# Patient Record
Sex: Female | Born: 1975 | Race: White | Hispanic: No | Marital: Married | Smoking: Never smoker
Health system: Southern US, Community
[De-identification: ages and names within clinical notes are randomized; demographics above are authoritative.]

## PROBLEM LIST (undated history)

## (undated) DIAGNOSIS — F419 Anxiety disorder, unspecified: Secondary | ICD-10-CM

## (undated) DIAGNOSIS — T8859XA Other complications of anesthesia, initial encounter: Secondary | ICD-10-CM

## (undated) DIAGNOSIS — K219 Gastro-esophageal reflux disease without esophagitis: Secondary | ICD-10-CM

## (undated) DIAGNOSIS — F32A Depression, unspecified: Secondary | ICD-10-CM

## (undated) DIAGNOSIS — R51 Headache: Secondary | ICD-10-CM

## (undated) DIAGNOSIS — Z9889 Other specified postprocedural states: Secondary | ICD-10-CM

## (undated) DIAGNOSIS — R112 Nausea with vomiting, unspecified: Secondary | ICD-10-CM

## (undated) DIAGNOSIS — F329 Major depressive disorder, single episode, unspecified: Secondary | ICD-10-CM

## (undated) DIAGNOSIS — M7989 Other specified soft tissue disorders: Secondary | ICD-10-CM

## (undated) DIAGNOSIS — R519 Headache, unspecified: Secondary | ICD-10-CM

## (undated) DIAGNOSIS — T4145XA Adverse effect of unspecified anesthetic, initial encounter: Secondary | ICD-10-CM

## (undated) HISTORY — PX: COLONOSCOPY: SHX174

## (undated) HISTORY — PX: WISDOM TOOTH EXTRACTION: SHX21

## (undated) HISTORY — PX: CHOLECYSTECTOMY: SHX55

## (undated) HISTORY — PX: ECTOPIC PREGNANCY SURGERY: SHX613

---

## 1998-04-11 ENCOUNTER — Other Ambulatory Visit: Admission: RE | Admit: 1998-04-11 | Discharge: 1998-04-11 | Payer: Self-pay | Admitting: Family Medicine

## 2001-06-28 ENCOUNTER — Other Ambulatory Visit: Admission: RE | Admit: 2001-06-28 | Discharge: 2001-06-28 | Payer: Self-pay | Admitting: Family Medicine

## 2002-07-04 ENCOUNTER — Encounter: Payer: Self-pay | Admitting: Family Medicine

## 2002-07-04 ENCOUNTER — Encounter: Admission: RE | Admit: 2002-07-04 | Discharge: 2002-07-04 | Payer: Self-pay | Admitting: Family Medicine

## 2002-07-04 ENCOUNTER — Other Ambulatory Visit: Admission: RE | Admit: 2002-07-04 | Discharge: 2002-07-04 | Payer: Self-pay | Admitting: Family Medicine

## 2002-10-06 ENCOUNTER — Ambulatory Visit (HOSPITAL_COMMUNITY): Admission: RE | Admit: 2002-10-06 | Discharge: 2002-10-06 | Payer: Self-pay | Admitting: Gastroenterology

## 2002-11-16 ENCOUNTER — Encounter: Admission: RE | Admit: 2002-11-16 | Discharge: 2002-11-16 | Payer: Self-pay | Admitting: Family Medicine

## 2002-11-16 ENCOUNTER — Encounter: Payer: Self-pay | Admitting: Family Medicine

## 2003-07-26 ENCOUNTER — Other Ambulatory Visit: Admission: RE | Admit: 2003-07-26 | Discharge: 2003-07-26 | Payer: Self-pay | Admitting: Family Medicine

## 2003-10-02 ENCOUNTER — Encounter: Payer: Self-pay | Admitting: Family Medicine

## 2003-10-02 ENCOUNTER — Encounter: Admission: RE | Admit: 2003-10-02 | Discharge: 2003-10-02 | Payer: Self-pay | Admitting: Family Medicine

## 2004-02-19 ENCOUNTER — Other Ambulatory Visit: Admission: RE | Admit: 2004-02-19 | Discharge: 2004-02-19 | Payer: Self-pay | Admitting: Obstetrics and Gynecology

## 2004-09-02 ENCOUNTER — Other Ambulatory Visit: Admission: RE | Admit: 2004-09-02 | Discharge: 2004-09-02 | Payer: Self-pay | Admitting: Obstetrics and Gynecology

## 2005-10-14 ENCOUNTER — Other Ambulatory Visit: Admission: RE | Admit: 2005-10-14 | Discharge: 2005-10-14 | Payer: Self-pay | Admitting: Family Medicine

## 2005-10-17 ENCOUNTER — Encounter: Admission: RE | Admit: 2005-10-17 | Discharge: 2005-10-17 | Payer: Self-pay | Admitting: Family Medicine

## 2017-07-09 ENCOUNTER — Encounter (HOSPITAL_COMMUNITY): Payer: Self-pay | Admitting: Emergency Medicine

## 2017-07-09 ENCOUNTER — Inpatient Hospital Stay (HOSPITAL_COMMUNITY)
Admission: EM | Admit: 2017-07-09 | Discharge: 2017-07-12 | DRG: 494 | Disposition: A | Discharge status: Home Health | Payer: BC Managed Care – PPO | Attending: Specialist | Admitting: Specialist

## 2017-07-09 ENCOUNTER — Emergency Department (HOSPITAL_COMMUNITY): Payer: BC Managed Care – PPO

## 2017-07-09 DIAGNOSIS — S82839A Other fracture of upper and lower end of unspecified fibula, initial encounter for closed fracture: Secondary | ICD-10-CM | POA: Diagnosis present

## 2017-07-09 DIAGNOSIS — S82201A Unspecified fracture of shaft of right tibia, initial encounter for closed fracture: Secondary | ICD-10-CM | POA: Diagnosis not present

## 2017-07-09 DIAGNOSIS — S82251A Displaced comminuted fracture of shaft of right tibia, initial encounter for closed fracture: Principal | ICD-10-CM | POA: Diagnosis present

## 2017-07-09 DIAGNOSIS — Y9321 Activity, ice skating: Secondary | ICD-10-CM

## 2017-07-09 DIAGNOSIS — Z79899 Other long term (current) drug therapy: Secondary | ICD-10-CM

## 2017-07-09 DIAGNOSIS — Z7982 Long term (current) use of aspirin: Secondary | ICD-10-CM

## 2017-07-09 DIAGNOSIS — S82251D Displaced comminuted fracture of shaft of right tibia, subsequent encounter for closed fracture with routine healing: Secondary | ICD-10-CM

## 2017-07-09 DIAGNOSIS — S82891A Other fracture of right lower leg, initial encounter for closed fracture: Secondary | ICD-10-CM | POA: Diagnosis present

## 2017-07-09 DIAGNOSIS — E669 Obesity, unspecified: Secondary | ICD-10-CM | POA: Diagnosis present

## 2017-07-09 DIAGNOSIS — S82401A Unspecified fracture of shaft of right fibula, initial encounter for closed fracture: Secondary | ICD-10-CM

## 2017-07-09 DIAGNOSIS — Z6831 Body mass index (BMI) 31.0-31.9, adult: Secondary | ICD-10-CM

## 2017-07-09 DIAGNOSIS — Z419 Encounter for procedure for purposes other than remedying health state, unspecified: Secondary | ICD-10-CM

## 2017-07-09 DIAGNOSIS — S82209A Unspecified fracture of shaft of unspecified tibia, initial encounter for closed fracture: Secondary | ICD-10-CM

## 2017-07-09 HISTORY — DX: Adverse effect of unspecified anesthetic, initial encounter: T41.45XA

## 2017-07-09 HISTORY — DX: Other complications of anesthesia, initial encounter: T88.59XA

## 2017-07-09 HISTORY — DX: Other specified postprocedural states: R11.2

## 2017-07-09 HISTORY — DX: Other specified postprocedural states: Z98.890

## 2017-07-09 LAB — COMPREHENSIVE METABOLIC PANEL
ALBUMIN: 4 g/dL (ref 3.5–5.0)
ALK PHOS: 63 U/L (ref 38–126)
ALT: 15 U/L (ref 14–54)
AST: 27 U/L (ref 15–41)
Anion gap: 10 (ref 5–15)
BUN: 14 mg/dL (ref 6–20)
CALCIUM: 8.6 mg/dL — AB (ref 8.9–10.3)
CHLORIDE: 111 mmol/L (ref 101–111)
CO2: 21 mmol/L — ABNORMAL LOW (ref 22–32)
CREATININE: 0.87 mg/dL (ref 0.44–1.00)
GFR calc Af Amer: >60 mL/min (ref >60)
GFR calc non Af Amer: >60 mL/min (ref >60)
GLUCOSE: 110 mg/dL — AB (ref 65–99)
Potassium: 4.2 mmol/L (ref 3.5–5.1)
SODIUM: 142 mmol/L (ref 135–145)
Total Bilirubin: 0.2 mg/dL — ABNORMAL LOW (ref 0.3–1.2)
Total Protein: 6.8 g/dL (ref 6.5–8.1)

## 2017-07-09 LAB — CBC
HCT: 39.4 % (ref 36.0–46.0)
Hemoglobin: 13.9 g/dL (ref 12.0–15.0)
MCH: 32.4 pg (ref 26.0–34.0)
MCHC: 35.3 g/dL (ref 30.0–36.0)
MCV: 91.8 fL (ref 78.0–100.0)
PLATELETS: 170 10*3/uL (ref 150–400)
RBC: 4.29 MIL/uL (ref 3.87–5.11)
RDW: 12.9 % (ref 11.5–15.5)
WBC: 11.7 10*3/uL — ABNORMAL HIGH (ref 4.0–10.5)

## 2017-07-09 LAB — I-STAT BETA HCG BLOOD, ED (MC, WL, AP ONLY)

## 2017-07-09 MED ORDER — SODIUM CHLORIDE 0.9 % IV BOLUS (SEPSIS)
1000.0000 mL | Freq: Once | INTRAVENOUS | Status: AC
  Administered 2017-07-09: 1000 mL via INTRAVENOUS

## 2017-07-09 MED ORDER — ONDANSETRON HCL 4 MG/2ML IJ SOLN
4.0000 mg | Freq: Once | INTRAMUSCULAR | Status: AC
  Administered 2017-07-09: 4 mg via INTRAVENOUS
  Filled 2017-07-09: qty 2

## 2017-07-09 MED ORDER — MORPHINE SULFATE (PF) 2 MG/ML IV SOLN
INTRAVENOUS | Status: AC
  Filled 2017-07-09: qty 1

## 2017-07-09 MED ORDER — MORPHINE SULFATE (PF) 4 MG/ML IV SOLN: 4.0000 mg | Freq: Once | INTRAVENOUS | Status: DC

## 2017-07-09 MED ORDER — OXYCODONE-ACETAMINOPHEN 5-325 MG PO TABS
2.0000 | ORAL_TABLET | Freq: Once | ORAL | Status: AC
  Administered 2017-07-09: 2 via ORAL
  Filled 2017-07-09: qty 2

## 2017-07-09 MED ORDER — MORPHINE SULFATE (PF) 4 MG/ML IV SOLN
4.0000 mg | Freq: Once | INTRAVENOUS | Status: AC
  Administered 2017-07-09: 4 mg via INTRAVENOUS
  Filled 2017-07-09: qty 1

## 2017-07-09 MED ORDER — MORPHINE SULFATE (PF) 4 MG/ML IV SOLN
4.0000 mg | INTRAVENOUS | Status: DC | PRN
  Administered 2017-07-09: 4 mg via INTRAVENOUS
  Administered 2017-07-10: 2 mg via INTRAVENOUS
  Administered 2017-07-10 (×2): 4 mg via INTRAVENOUS
  Filled 2017-07-09 (×6): qty 1

## 2017-07-09 MED ORDER — FENTANYL CITRATE (PF) 100 MCG/2ML IJ SOLN
INTRAMUSCULAR | Status: AC
  Administered 2017-07-09: 100 ug
  Filled 2017-07-09: qty 2

## 2017-07-09 NOTE — ED Notes (Signed)
Post Morphine administration, patient began to get pale, diaphoretic, nauseated,and hypotensive. MD notified. NS bolus initiated.

## 2017-07-09 NOTE — Progress Notes (Signed)
Orthopedic Tech Progress Note Patient Details:  Adonis HousekeeperDiana S Coleman- Rossy 05/21/1976 [id number]  Spoke to Dr. Clarene DukeLittle and she ordered a posterior short leg splint with stirrup. Ortho Devices Type of Ortho Device: Post (short) splint, Ace wrap, Stirrup splint Splint Material: Plaster Ortho Device/Splint Location: Well padded plaster posterior and stirrup splint to Rt Leg Ortho Device/Splint Interventions: Application, Adjustment   Clois Dupesvery S Namira Rosekrans 07/09/2017, 10:03 PM

## 2017-07-09 NOTE — ED Notes (Signed)
Bed: [id number] Expected date:  Expected time:  Means of arrival:  Comments: EMS-leg deformity

## 2017-07-09 NOTE — ED Provider Notes (Signed)
[facility]-EMERGENCY DEPT Provider Note   CSN: [id number] Arrival date & time: 07/09/17  1803     History   Chief Complaint Chief Complaint  Patient presents with  . Leg Injury    right    HPI Hailey Carter is a 41 y.o. female.  41 year old healthy female who presents with right leg injury. Just prior to arrival, the patient was ice skating when she fell and somehow twisted her right lower leg during the fall. She did not strike her head or lose consciousness. She had a sudden onset of severe, constant pain in her right lower leg. She reports normal sensation to her foot. No neck, head, back, chest, or abdominal pain. She last ate at 2:30 PM, last drink of water at 3:30 PM. She received 200 g fentanyl in route.   The history is provided by the patient.    History reviewed. No pertinent past medical history.  Patient Active Problem List   Diagnosis Date Noted  . Tibia/fibula fracture, right, closed, initial encounter 07/09/2017    Past Surgical History:  Procedure Laterality Date  . CHOLECYSTECTOMY    . COLONOSCOPY    . ECTOPIC PREGNANCY SURGERY    . WISDOM TOOTH EXTRACTION      OB History    No data available       Home Medications    Prior to Admission medications   Medication Sig Start Date End Date Taking? Authorizing Provider  acetaminophen (TYLENOL) 325 MG tablet Take 650 mg by mouth every 6 (six) hours as needed for moderate pain.   Yes [provider]  aspirin EC 81 MG tablet Take 81 mg by mouth daily.   Yes [provider]  folic acid (FOLVITE) 1 MG tablet Take 4 mg by mouth daily.   Yes [provider]  montelukast (SINGULAIR) 10 MG tablet Take 10 mg by mouth at bedtime.   Yes [provider]  Multiple Vitamin (MULTIVITAMIN WITH MINERALS) TABS tablet Take 1 tablet by mouth daily.   Yes [provider]  sertraline (ZOLOFT) 50 MG tablet Take 50 mg by mouth daily.   Yes [provider]    Family  History No family history on file.  Social History Social History  Substance Use Topics  . Smoking status: Never Smoker  . Smokeless tobacco: Never Used  . Alcohol use No     Allergies   Patient has no known allergies.   Review of Systems Review of Systems All other systems reviewed and are negative except that which was mentioned in HPI   Physical Exam Updated Vital Signs BP 116/87   Pulse 84   Temp 98.2 F (36.8 C) (Oral)   Resp 18   Ht 5\' 9"  (1.753 m)   Wt 97.5 kg (215 lb)   LMP 06/16/2017   SpO2 99%   BMI 31.75 kg/m   Physical Exam  Constitutional: She is oriented to person, place, and time. She appears well-developed and well-nourished. No distress.  HENT:  Head: Normocephalic and atraumatic.  Moist mucous membranes  Eyes: Pupils are equal, round, and reactive to light. Conjunctivae are normal.  Neck: Neck supple.  Cardiovascular: Normal rate, regular rhythm, normal heart sounds and intact distal pulses.   No murmur heard. Pulmonary/Chest: Effort normal and breath sounds normal.  Abdominal: Soft. Bowel sounds are normal. She exhibits no distension. There is no tenderness.  Musculoskeletal: She exhibits edema, tenderness and deformity.  Closed deformity with swelling of right lower leg, faint  ecchymosis developing over distal anterior tibia; upper compartments of leg soft; normal sensation foot, 2+ DP/PT pulses; no tenderness of thigh, knee, or foot  Neurological: She is alert and oriented to person, place, and time. No sensory deficit.  Fluent speech  Skin: Skin is warm and dry. Capillary refill takes less than 2 seconds.  Psychiatric: She has a normal mood and affect. Judgment normal.  Nursing note and vitals reviewed.    ED Treatments / Results  Labs (all labs ordered are listed, but only abnormal results are displayed) Labs Reviewed  COMPREHENSIVE METABOLIC PANEL - Abnormal; Notable for the following:       Result Value   CO2 21 (*)    Glucose,  Bld 110 (*)    Calcium 8.6 (*)    Total Bilirubin 0.2 (*)    All other components within normal limits  CBC - Abnormal; Notable for the following:    WBC 11.7 (*)    All other components within normal limits  I-STAT BETA HCG BLOOD, ED ([facility], [facility], [facility] ONLY)    EKG  EKG Interpretation None       Radiology Dg Tibia/fibula Right  Result Date: 07/09/2017 CLINICAL DATA:  [place] today; pain in right tib fib, more on distal right tib fib; no previous injury EXAM: RIGHT TIBIA AND FIBULA - 2 VIEW COMPARISON:  None. FINDINGS: Significantly displaced fracture of the distal right tibia, located within the distal diaphysis, with anterior displacement of the proximal fracture fragment and overriding of the fracture fragments. Displaced/comminuted fracture of the proximal right fibula. Additional slightly displaced fracture within the distal right fibula. Ankle mortise remains grossly symmetric. Alignment at the right knee appears normal. IMPRESSION: 1. Significantly displaced fracture of the distal right tibia, at the level of the distal diaphysis, with anterior displacement of the proximal fracture fragment and overriding of the fracture fragments. 2. Displaced/comminuted fracture of the proximal right fibula, at the level the proximal metaphysis. 3. Slightly displaced obliquely oriented fracture of the distal right fibula, at the level of the distal metadiaphysis. Electronically Signed   By: Bary RichardStan  Maynard M.D.   On: 07/09/2017 19:31    Procedures Procedures (including critical care time)  Medications Ordered in ED Medications  morphine 4 MG/ML injection 4 mg (4 mg Intravenous Given 07/09/17 2030)  morphine 4 MG/ML injection 4 mg (4 mg Intravenous Not Given 07/09/17 2045)  morphine 4 MG/ML injection 4 mg (4 mg Intravenous Given 07/09/17 1828)  ondansetron (ZOFRAN) injection 4 mg (4 mg Intravenous Given 07/09/17 2048)  sodium chloride 0.9 % bolus 1,000 mL (0 mLs Intravenous Stopped 07/09/17  2136)  fentaNYL (SUBLIMAZE) 100 MCG/2ML injection (100 mcg  Given 07/09/17 2149)  oxyCODONE-acetaminophen (PERCOCET/ROXICET) 5-325 MG per tablet 2 tablet (2 tablets Oral Given 07/09/17 2245)     Initial Impression / Assessment and Plan / ED Course  I have reviewed the triage vital signs and the nursing notes.  Pertinent labs & imaging results that were available during my care of the patient were reviewed by me and considered in my medical decision making (see chart for details).     PT w/ R lower leg deformity after fall. Neurovascularly intact on exam. Gave morphine and obtained screening labs.   XR show Fractures of the distal right tibia with displacement, displaced and comminuted fracture of proximal right fibula and distal right fibula. I discussed findings with orthopedics, Dr. August Saucerean,  And per his recommendations placed the patient in a posterior splint. Pt  transferred to [facility] for admission to orthopedics service. Final Clinical Impressions(s) / ED Diagnoses   Final diagnoses:  Closed fracture of right tibia and fibula, initial encounter    New Prescriptions New Prescriptions   No medications on file     Little, Ambrose Finland, MD 07/10/17 [phone]

## 2017-07-09 NOTE — ED Triage Notes (Signed)
Pt here via EMS with c/o right lower leg pain following a fall while ice skating. Pt has deformity and swelling to lower leg. Pt is able to move toes and has adequate distal pulses. Pt denies blood thinner use and did not experience LOC/head injury

## 2017-07-10 ENCOUNTER — Observation Stay (HOSPITAL_COMMUNITY): Payer: BC Managed Care – PPO

## 2017-07-10 ENCOUNTER — Encounter (HOSPITAL_COMMUNITY): Payer: Self-pay | Admitting: Surgery

## 2017-07-10 ENCOUNTER — Encounter (HOSPITAL_COMMUNITY)
Admission: EM | Disposition: A | Discharge status: Home Health | Payer: Self-pay | Source: Home / Self Care | Attending: Orthopedic Surgery

## 2017-07-10 ENCOUNTER — Observation Stay (HOSPITAL_COMMUNITY): Payer: BC Managed Care – PPO | Admitting: Certified Registered Nurse Anesthetist

## 2017-07-10 DIAGNOSIS — S82832A Other fracture of upper and lower end of left fibula, initial encounter for closed fracture: Secondary | ICD-10-CM | POA: Diagnosis not present

## 2017-07-10 DIAGNOSIS — S82209A Unspecified fracture of shaft of unspecified tibia, initial encounter for closed fracture: Secondary | ICD-10-CM | POA: Diagnosis present

## 2017-07-10 DIAGNOSIS — S82301A Unspecified fracture of lower end of right tibia, initial encounter for closed fracture: Secondary | ICD-10-CM

## 2017-07-10 HISTORY — PX: TIBIA IM NAIL INSERTION: SHX2516

## 2017-07-10 HISTORY — PX: ORIF ANKLE FRACTURE: SUR919

## 2017-07-10 HISTORY — PX: IM NAILING TIBIA: SUR734

## 2017-07-10 SURGERY — INSERTION, INTRAMEDULLARY ROD, TIBIA
Anesthesia: General | Site: Leg Lower | Laterality: Right

## 2017-07-10 MED ORDER — ADULT MULTIVITAMIN W/MINERALS CH
1.0000 | ORAL_TABLET | Freq: Every day | ORAL | Status: DC
  Administered 2017-07-11 – 2017-07-12 (×2): 1 via ORAL
  Filled 2017-07-10 (×2): qty 1

## 2017-07-10 MED ORDER — PHENYLEPHRINE HCL 10 MG/ML IJ SOLN
INTRAVENOUS | Status: DC | PRN
  Administered 2017-07-10: 15 ug/min via INTRAVENOUS

## 2017-07-10 MED ORDER — POTASSIUM CHLORIDE IN NACL 20-0.9 MEQ/L-% IV SOLN
INTRAVENOUS | Status: AC
  Administered 2017-07-10: 18:00:00 via INTRAVENOUS
  Filled 2017-07-10 (×2): qty 1000

## 2017-07-10 MED ORDER — BUPIVACAINE-EPINEPHRINE 0.5% -1:200000 IJ SOLN
INTRAMUSCULAR | Status: DC | PRN
  Administered 2017-07-10: 30 mL

## 2017-07-10 MED ORDER — FOLIC ACID 1 MG PO TABS
4.0000 mg | ORAL_TABLET | Freq: Every day | ORAL | Status: DC
  Administered 2017-07-11 – 2017-07-12 (×2): 4 mg via ORAL
  Filled 2017-07-10 (×2): qty 4

## 2017-07-10 MED ORDER — PROPOFOL 10 MG/ML IV BOLUS
INTRAVENOUS | Status: DC | PRN
  Administered 2017-07-10: 20 mg via INTRAVENOUS
  Administered 2017-07-10: 150 mg via INTRAVENOUS

## 2017-07-10 MED ORDER — ONDANSETRON HCL 4 MG/2ML IJ SOLN
4.0000 mg | Freq: Four times a day (QID) | INTRAMUSCULAR | Status: DC | PRN
  Administered 2017-07-10: 4 mg via INTRAVENOUS
  Filled 2017-07-10: qty 2

## 2017-07-10 MED ORDER — LIDOCAINE HCL (CARDIAC) 20 MG/ML IV SOLN
INTRAVENOUS | Status: DC | PRN
  Administered 2017-07-10: 60 mg via INTRAVENOUS

## 2017-07-10 MED ORDER — SODIUM CHLORIDE 0.9 % IV SOLN
INTRAVENOUS | Status: DC | PRN
  Administered 2017-07-10: 80 ug via INTRAVENOUS

## 2017-07-10 MED ORDER — CEFAZOLIN SODIUM-DEXTROSE 2-4 GM/100ML-% IV SOLN
2.0000 g | INTRAVENOUS | Status: AC
  Administered 2017-07-10: 2 g via INTRAVENOUS

## 2017-07-10 MED ORDER — ONDANSETRON HCL 4 MG PO TABS: 4.0000 mg | ORAL_TABLET | Freq: Four times a day (QID) | ORAL | Status: DC | PRN

## 2017-07-10 MED ORDER — DEXAMETHASONE SODIUM PHOSPHATE 10 MG/ML IJ SOLN
INTRAMUSCULAR | Status: DC | PRN
  Administered 2017-07-10: 10 mg via INTRAVENOUS

## 2017-07-10 MED ORDER — MORPHINE SULFATE (PF) 4 MG/ML IV SOLN
INTRAVENOUS | Status: DC | PRN
  Administered 2017-07-10: 8 mg via INTRAMUSCULAR

## 2017-07-10 MED ORDER — ACETAMINOPHEN 10 MG/ML IV SOLN
INTRAVENOUS | Status: AC
  Filled 2017-07-10: qty 100

## 2017-07-10 MED ORDER — EPHEDRINE SULFATE 50 MG/ML IJ SOLN
INTRAMUSCULAR | Status: DC | PRN
  Administered 2017-07-10: 10 mg via INTRAVENOUS

## 2017-07-10 MED ORDER — PROPOFOL 10 MG/ML IV BOLUS
INTRAVENOUS | Status: AC
  Filled 2017-07-10: qty 20

## 2017-07-10 MED ORDER — MORPHINE SULFATE (PF) 4 MG/ML IV SOLN
INTRAVENOUS | Status: AC
  Filled 2017-07-10: qty 2

## 2017-07-10 MED ORDER — SERTRALINE HCL 50 MG PO TABS
50.0000 mg | ORAL_TABLET | Freq: Every day | ORAL | Status: DC
  Administered 2017-07-10 – 2017-07-12 (×3): 50 mg via ORAL
  Filled 2017-07-10 (×3): qty 1

## 2017-07-10 MED ORDER — PROPOFOL 10 MG/ML IV BOLUS
INTRAVENOUS | Status: AC
Start: 2017-07-10 — End: 2017-07-10
  Filled 2017-07-10: qty 20

## 2017-07-10 MED ORDER — METOCLOPRAMIDE HCL 5 MG PO TABS: 5.0000 mg | ORAL_TABLET | Freq: Three times a day (TID) | ORAL | Status: DC | PRN

## 2017-07-10 MED ORDER — POTASSIUM CHLORIDE IN NACL 20-0.9 MEQ/L-% IV SOLN
INTRAVENOUS | Status: DC
  Filled 2017-07-10: qty 1000

## 2017-07-10 MED ORDER — ACETAMINOPHEN 650 MG RE SUPP: 650.0000 mg | Freq: Four times a day (QID) | RECTAL | Status: DC | PRN

## 2017-07-10 MED ORDER — RIVAROXABAN 10 MG PO TABS
10.0000 mg | ORAL_TABLET | Freq: Every day | ORAL | Status: DC
  Administered 2017-07-11 – 2017-07-12 (×2): 10 mg via ORAL
  Filled 2017-07-10 (×2): qty 1

## 2017-07-10 MED ORDER — ONDANSETRON HCL 4 MG/2ML IJ SOLN
INTRAMUSCULAR | Status: DC | PRN
  Administered 2017-07-10: 4 mg via INTRAVENOUS

## 2017-07-10 MED ORDER — ACETAMINOPHEN 10 MG/ML IV SOLN
INTRAVENOUS | Status: DC | PRN
  Administered 2017-07-10: 1000 mg via INTRAVENOUS

## 2017-07-10 MED ORDER — NEOSTIGMINE METHYLSULFATE 10 MG/10ML IV SOLN
INTRAVENOUS | Status: DC | PRN
  Administered 2017-07-10: 2 mg via INTRAVENOUS

## 2017-07-10 MED ORDER — LACTATED RINGERS IV SOLN
INTRAVENOUS | Status: DC
  Administered 2017-07-10 (×3): via INTRAVENOUS

## 2017-07-10 MED ORDER — FENTANYL CITRATE (PF) 250 MCG/5ML IJ SOLN
INTRAMUSCULAR | Status: AC
  Filled 2017-07-10: qty 5

## 2017-07-10 MED ORDER — DEXAMETHASONE SODIUM PHOSPHATE 4 MG/ML IJ SOLN
INTRAMUSCULAR | Status: DC | PRN
  Administered 2017-07-10: 10 mg via INTRAVENOUS

## 2017-07-10 MED ORDER — ROCURONIUM BROMIDE 100 MG/10ML IV SOLN
INTRAVENOUS | Status: DC | PRN
  Administered 2017-07-10: 50 mg via INTRAVENOUS

## 2017-07-10 MED ORDER — FENTANYL CITRATE (PF) 100 MCG/2ML IJ SOLN
INTRAMUSCULAR | Status: DC | PRN
  Administered 2017-07-10 (×7): 50 ug via INTRAVENOUS

## 2017-07-10 MED ORDER — BUPIVACAINE HCL (PF) 0.5 % IJ SOLN
INTRAMUSCULAR | Status: AC
  Filled 2017-07-10: qty 30

## 2017-07-10 MED ORDER — MORPHINE SULFATE (PF) 4 MG/ML IV SOLN: 4.0000 mg | INTRAVENOUS | Status: DC | PRN

## 2017-07-10 MED ORDER — CEFAZOLIN SODIUM-DEXTROSE 2-4 GM/100ML-% IV SOLN
2.0000 g | Freq: Four times a day (QID) | INTRAVENOUS | Status: AC
  Administered 2017-07-10 – 2017-07-11 (×2): 2 g via INTRAVENOUS
  Filled 2017-07-10 (×2): qty 100

## 2017-07-10 MED ORDER — FENTANYL CITRATE (PF) 250 MCG/5ML IJ SOLN
INTRAMUSCULAR | Status: AC
Start: 2017-07-10 — End: 2017-07-10
  Filled 2017-07-10: qty 5

## 2017-07-10 MED ORDER — MIDAZOLAM HCL 2 MG/2ML IJ SOLN
INTRAMUSCULAR | Status: AC
  Filled 2017-07-10: qty 2

## 2017-07-10 MED ORDER — CEFAZOLIN SODIUM-DEXTROSE 2-4 GM/100ML-% IV SOLN
INTRAVENOUS | Status: AC
  Filled 2017-07-10: qty 100

## 2017-07-10 MED ORDER — 0.9 % SODIUM CHLORIDE (POUR BTL) OPTIME
TOPICAL | Status: DC | PRN
  Administered 2017-07-10 (×4): 1000 mL

## 2017-07-10 MED ORDER — HYDROMORPHONE HCL 1 MG/ML IJ SOLN: 0.2500 mg | INTRAMUSCULAR | Status: DC | PRN

## 2017-07-10 MED ORDER — MONTELUKAST SODIUM 10 MG PO TABS
10.0000 mg | ORAL_TABLET | Freq: Every day | ORAL | Status: DC
  Administered 2017-07-10 – 2017-07-11 (×2): 10 mg via ORAL
  Filled 2017-07-10 (×2): qty 1

## 2017-07-10 MED ORDER — CLONIDINE HCL (ANALGESIA) 100 MCG/ML EP SOLN
EPIDURAL | Status: DC | PRN
  Administered 2017-07-10: 100 ug

## 2017-07-10 MED ORDER — SCOPOLAMINE 1 MG/3DAYS TD PT72
MEDICATED_PATCH | TRANSDERMAL | Status: DC | PRN
  Administered 2017-07-10: 1 via TRANSDERMAL

## 2017-07-10 MED ORDER — OXYCODONE HCL 5 MG PO TABS
5.0000 mg | ORAL_TABLET | ORAL | Status: DC | PRN
  Administered 2017-07-10 – 2017-07-12 (×13): 10 mg via ORAL
  Filled 2017-07-10 (×13): qty 2

## 2017-07-10 MED ORDER — ACETAMINOPHEN 325 MG PO TABS
650.0000 mg | ORAL_TABLET | Freq: Four times a day (QID) | ORAL | Status: DC | PRN
  Administered 2017-07-12: 650 mg via ORAL
  Filled 2017-07-10: qty 2

## 2017-07-10 MED ORDER — POVIDONE-IODINE 10 % EX SWAB: 2.0000 "application " | Freq: Once | CUTANEOUS | Status: DC

## 2017-07-10 MED ORDER — CHLORHEXIDINE GLUCONATE 4 % EX LIQD: 60.0000 mL | Freq: Once | CUTANEOUS | Status: DC

## 2017-07-10 MED ORDER — METOCLOPRAMIDE HCL 5 MG/ML IJ SOLN: 5.0000 mg | Freq: Three times a day (TID) | INTRAMUSCULAR | Status: DC | PRN

## 2017-07-10 MED ORDER — MIDAZOLAM HCL 5 MG/5ML IJ SOLN
INTRAMUSCULAR | Status: DC | PRN
  Administered 2017-07-10: 2 mg via INTRAVENOUS

## 2017-07-10 MED ORDER — GLYCOPYRROLATE 0.2 MG/ML IJ SOLN
INTRAMUSCULAR | Status: DC | PRN
  Administered 2017-07-10: 0.4 mg via INTRAVENOUS

## 2017-07-10 SURGICAL SUPPLY — 109 items
BANDAGE ELASTIC 4 VELCRO ST LF (GAUZE/BANDAGES/DRESSINGS) ×2 IMPLANT
BANDAGE ESMARK 6X9 LF (GAUZE/BANDAGES/DRESSINGS) IMPLANT
BIT DRILL 3.5X122MM AO FIT (BIT) ×2 IMPLANT
BIT DRILL AO GAMMA 4.2X130 (BIT) ×2 IMPLANT
BIT DRILL AO GAMMA 4.2X180 (BIT) ×2 IMPLANT
BIT DRILL AO GAMMA 4.2X340 (BIT) ×2 IMPLANT
BLADE CLIPPER SURG (BLADE) IMPLANT
BLADE SURG 10 STRL SS (BLADE) ×2 IMPLANT
BLADE SURG 15 STRL LF DISP TIS (BLADE) ×1 IMPLANT
BLADE SURG 15 STRL SS (BLADE) ×3
BNDG CMPR 9X4 STRL LF SNTH (GAUZE/BANDAGES/DRESSINGS) ×1
BNDG CMPR 9X6 STRL LF SNTH (GAUZE/BANDAGES/DRESSINGS)
BNDG CMPR MED 15X6 ELC VLCR LF (GAUZE/BANDAGES/DRESSINGS) ×1
BNDG COHESIVE 6X5 TAN STRL LF (GAUZE/BANDAGES/DRESSINGS) ×3 IMPLANT
BNDG ELASTIC 6X15 VLCR STRL LF (GAUZE/BANDAGES/DRESSINGS) ×2 IMPLANT
BNDG ESMARK 4X9 LF (GAUZE/BANDAGES/DRESSINGS) ×2 IMPLANT
BNDG ESMARK 6X9 LF (GAUZE/BANDAGES/DRESSINGS)
BNDG GAUZE ELAST 4 BULKY (GAUZE/BANDAGES/DRESSINGS) ×1 IMPLANT
CLOSURE STERI-STRIP 1/2X4 (GAUZE/BANDAGES/DRESSINGS) ×1
CLSR STERI-STRIP ANTIMIC 1/2X4 (GAUZE/BANDAGES/DRESSINGS) ×1 IMPLANT
COVER SURGICAL LIGHT HANDLE (MISCELLANEOUS) ×6 IMPLANT
CUFF TOURNIQUET SINGLE 34IN LL (TOURNIQUET CUFF) IMPLANT
CUFF TOURNIQUET SINGLE 44IN (TOURNIQUET CUFF) IMPLANT
DRAPE C-ARM 42X72 X-RAY (DRAPES) ×3 IMPLANT
DRAPE C-ARMOR (DRAPES) ×2 IMPLANT
DRAPE HALF SHEET 40X57 (DRAPES) ×6 IMPLANT
DRAPE IMP U-DRAPE 54X76 (DRAPES) ×3 IMPLANT
DRAPE INCISE IOBAN 66X45 STRL (DRAPES) ×6 IMPLANT
DRAPE ORTHO SPLIT 77X108 STRL (DRAPES) ×6
DRAPE SURG ORHT 6 SPLT 77X108 (DRAPES) ×2 IMPLANT
DRAPE U-SHAPE 47X51 STRL (DRAPES) ×3 IMPLANT
DRILL 2.6X122MM WL AO SHAFT (BIT) ×2 IMPLANT
DRSG PAD ABDOMINAL 8X10 ST (GAUZE/BANDAGES/DRESSINGS) ×2 IMPLANT
DRSG TEGADERM 4X4.75 (GAUZE/BANDAGES/DRESSINGS) ×2 IMPLANT
DURAPREP 26ML APPLICATOR (WOUND CARE) ×3 IMPLANT
ELECT REM PT RETURN 9FT ADLT (ELECTROSURGICAL) ×3
ELECTRODE REM PT RTRN 9FT ADLT (ELECTROSURGICAL) ×1 IMPLANT
FACESHIELD WRAPAROUND (MASK) IMPLANT
FACESHIELD WRAPAROUND OR TEAM (MASK) IMPLANT
GAUZE SPONGE 4X4 12PLY STRL (GAUZE/BANDAGES/DRESSINGS) ×4 IMPLANT
GAUZE XEROFORM 5X9 LF (GAUZE/BANDAGES/DRESSINGS) ×3 IMPLANT
GLOVE BIO SURGEON STRL SZ 6.5 (GLOVE) ×4 IMPLANT
GLOVE BIO SURGEON STRL SZ7 (GLOVE) ×4 IMPLANT
GLOVE BIO SURGEONS STRL SZ 6.5 (GLOVE) ×4
GLOVE BIOGEL PI IND STRL 6.5 (GLOVE) IMPLANT
GLOVE BIOGEL PI IND STRL 7.0 (GLOVE) IMPLANT
GLOVE BIOGEL PI IND STRL 8 (GLOVE) ×1 IMPLANT
GLOVE BIOGEL PI INDICATOR 6.5 (GLOVE) ×2
GLOVE BIOGEL PI INDICATOR 7.0 (GLOVE) ×6
GLOVE BIOGEL PI INDICATOR 8 (GLOVE) ×2
GLOVE INDICATOR 7.5 STRL GRN (GLOVE) ×2 IMPLANT
GLOVE SURG ORTHO 8.0 STRL STRW (GLOVE) ×3 IMPLANT
GLOVE SURG SS PI 6.0 STRL IVOR (GLOVE) ×2 IMPLANT
GOWN STRL REUS W/ TWL LRG LVL3 (GOWN DISPOSABLE) ×2 IMPLANT
GOWN STRL REUS W/ TWL XL LVL3 (GOWN DISPOSABLE) ×1 IMPLANT
GOWN STRL REUS W/TWL LRG LVL3 (GOWN DISPOSABLE) ×9
GOWN STRL REUS W/TWL XL LVL3 (GOWN DISPOSABLE) ×6
GUIDEROD T2 3X1000 (ROD) ×2 IMPLANT
GUIDEWIRE GAMMA (WIRE) ×4 IMPLANT
K-WIRE FIXATION 3X285 COATED (WIRE) ×6
KIT BASIN OR (CUSTOM PROCEDURE TRAY) ×3 IMPLANT
KIT ROOM TURNOVER OR (KITS) ×3 IMPLANT
KWIRE FIXATION 3X285 COATED (WIRE) IMPLANT
MANIFOLD NEPTUNE II (INSTRUMENTS) ×3 IMPLANT
NAIL ELAS INSERT SLV SPI 8-11 (MISCELLANEOUS) ×2 IMPLANT
NAIL TIBIAL STD 9X345MM (Nail) ×2 IMPLANT
NDL 18GX1X1/2 (RX/OR ONLY) (NEEDLE) IMPLANT
NEEDLE 18GX1X1/2 (RX/OR ONLY) (NEEDLE) ×3 IMPLANT
NEEDLE 22X1 1/2 (OR ONLY) (NEEDLE) ×2 IMPLANT
NS IRRIG 1000ML POUR BTL (IV SOLUTION) ×9 IMPLANT
PACK GENERAL/GYN (CUSTOM PROCEDURE TRAY) ×3 IMPLANT
PACK UNIVERSAL I (CUSTOM PROCEDURE TRAY) ×3 IMPLANT
PAD ARMBOARD 7.5X6 YLW CONV (MISCELLANEOUS) ×6 IMPLANT
PAD CAST 4YDX4 CTTN HI CHSV (CAST SUPPLIES) IMPLANT
PADDING CAST COTTON 4X4 STRL (CAST SUPPLIES) ×3
PADDING CAST COTTON 6X4 STRL (CAST SUPPLIES) ×2 IMPLANT
PLATE STR 6HOLE (Plate) ×2 IMPLANT
REAMER INTRAMEDULLARY 8MM 510 (MISCELLANEOUS) ×2 IMPLANT
SCREW BONE 14MMX3.5MM (Screw) ×2 IMPLANT
SCREW BONE 18 (Screw) ×2 IMPLANT
SCREW BONE 3.5X16MM (Screw) ×2 IMPLANT
SCREW BONE NON-LCKING 3.5X12MM (Screw) ×6 IMPLANT
SCREW GAMMA (Screw) ×2 IMPLANT
SCREW LOCKING FULL THREAD 5X52 (Screw) ×2 IMPLANT
SCREW LOCKING T2 F/T  5MMX30MM (Screw) ×2 IMPLANT
SCREW LOCKING T2 F/T  5X32.5MM (Screw) ×2 IMPLANT
SCREW LOCKING T2 F/T  5X42.5MM (Screw) ×2 IMPLANT
SCREW LOCKING T2 F/T 5MMX30MM (Screw) IMPLANT
SCREW LOCKING T2 F/T 5X32.5MM (Screw) IMPLANT
SCREW LOCKING T2 F/T 5X42.5MM (Screw) IMPLANT
SPLINT PLASTER CAST XFAST 5X30 (CAST SUPPLIES) IMPLANT
SPLINT PLASTER XFAST SET 5X30 (CAST SUPPLIES) ×2
SPONGE LAP 18X18 X RAY DECT (DISPOSABLE) ×2 IMPLANT
STAPLER VISISTAT 35W (STAPLE) ×3 IMPLANT
STOCKINETTE IMPERVIOUS LG (DRAPES) ×3 IMPLANT
SUT ETHILON 3 0 PS 1 (SUTURE) ×12 IMPLANT
SUT MNCRL AB 3-0 PS2 18 (SUTURE) ×2 IMPLANT
SUT VIC AB 0 CT1 27 (SUTURE) ×6
SUT VIC AB 0 CT1 27XBRD ANBCTR (SUTURE) ×1 IMPLANT
SUT VIC AB 1 CT1 27 (SUTURE) ×3
SUT VIC AB 1 CT1 27XBRD ANBCTR (SUTURE) IMPLANT
SUT VIC AB 2-0 CT1 27 (SUTURE) ×12
SUT VIC AB 2-0 CT1 TAPERPNT 27 (SUTURE) ×1 IMPLANT
SYR 3ML LL SCALE MARK (SYRINGE) ×2 IMPLANT
SYR CONTROL 10ML LL (SYRINGE) ×2 IMPLANT
TOWEL OR 17X24 6PK STRL BLUE (TOWEL DISPOSABLE) ×3 IMPLANT
TOWEL OR 17X26 10 PK STRL BLUE (TOWEL DISPOSABLE) ×3 IMPLANT
TRAY FOLEY W/METER SILVER 16FR (SET/KITS/TRAYS/PACK) ×2 IMPLANT
WATER STERILE IRR 1000ML POUR (IV SOLUTION) ×1 IMPLANT

## 2017-07-10 NOTE — Anesthesia Postprocedure Evaluation (Signed)
Anesthesia Post Note  Patient: [name]  Procedure(s) Performed: Procedure(s) (LRB): INTRAMEDULLARY (IM) NAIL TIBIAL ORIF ANKLE FRACTURE (Right)     Patient location during evaluation: PACU Anesthesia Type: General Level of consciousness: awake and alert Pain management: pain level controlled Vital Signs Assessment: post-procedure vital signs reviewed and stable Respiratory status: spontaneous breathing, nonlabored ventilation and respiratory function stable Cardiovascular status: blood pressure returned to baseline and stable Postop Assessment: no signs of nausea or vomiting Anesthetic complications: no    Last Vitals:  Vitals:   07/10/17 1605 07/10/17 1626  BP: 105/65 110/66  Pulse: 86 87  Resp: 14   Temp: 36.7 C 36.6 C    Last Pain:  Vitals:   07/10/17 1626  TempSrc: Oral  PainSc:                  Amparo Donalson,W. EDMOND

## 2017-07-10 NOTE — Anesthesia Preprocedure Evaluation (Signed)
Anesthesia Evaluation  Patient identified by MRN, date of birth, ID band Patient awake    Reviewed: Allergy & Precautions, H&P , NPO status , Patient's Chart, lab work & pertinent test results  Airway Mallampati: II  TM Distance: >3 FB Neck ROM: Full    Dental no notable dental hx. (+) Teeth Intact, Dental Advisory Given   Pulmonary neg pulmonary ROS,    Pulmonary exam normal breath sounds clear to auscultation       Cardiovascular negative cardio ROS   Rhythm:Regular Rate:Normal     Neuro/Psych negative neurological ROS  negative psych ROS   GI/Hepatic negative GI ROS, Neg liver ROS,   Endo/Other  negative endocrine ROS  Renal/GU negative Renal ROS  negative genitourinary   Musculoskeletal   Abdominal   Peds  Hematology negative hematology ROS (+)   Anesthesia Other Findings   Reproductive/Obstetrics negative OB ROS                             Anesthesia Physical Anesthesia Plan  ASA: II  Anesthesia Plan: General   Post-op Pain Management:    Induction: Intravenous  PONV Risk Score and Plan: 4 or greater and Ondansetron, Dexamethasone, Midazolam and Scopolamine patch - Pre-op  Airway Management Planned: Oral ETT  Additional Equipment:   Intra-op Plan:   Post-operative Plan: Extubation in OR  Informed Consent: I have reviewed the patients History and Physical, chart, labs and discussed the procedure including the risks, benefits and alternatives for the proposed anesthesia with the patient or authorized representative who has indicated his/her understanding and acceptance.   Dental advisory given  Plan Discussed with: CRNA  Anesthesia Plan Comments:         Anesthesia Quick Evaluation

## 2017-07-10 NOTE — Op Note (Signed)
Hailey Carter, Hailey Carter NO.:  [id number]  MEDICAL RECORD NO.:  [id number]  LOCATION:  NW29                         FACILITY:  [facility]  PHYSICIAN:  Burnard Bunting, M.D.    DATE OF BIRTH:  Jan 09, 1976  DATE OF PROCEDURE: DATE OF DISCHARGE:                              OPERATIVE REPORT   PREOPERATIVE DIAGNOSES:  Right leg tibia fracture and ankle fracture.  POSTOPERATIVE DIAGNOSIS:  Right leg tibia fracture and ankle fracture.  PROCEDURE:  Right leg intramedullary nail using Stryker T2 tibial nail 9 mm x 345 mm nail with two 5 mm oblique static locking screws proximally and 2 medial lateral distal locking screws.  Fibular plating was 6-0 fibular straight plate with 1 anterior-posterior lag screw.  SURGEON:  Burnard Bunting, M.D.  ASSISTANT:  Patrick Jupiter.  INDICATIONS:  Hailey Carter is a patient who fell skating and she presents now for operative management of distal tibia fracture along with fibular shaft fracture.  PROCEDURE IN DETAIL:  The patient was brought to the operating room where general anesthetic was induced.  Preoperative antibiotics administered.  Time-out was called.  Right leg was prescrubbed alcohol and Betadine, allowed to air dry, prepped with DuraPrep solution and draped in sterile manner.  Hailey Carter was used to cover the cover the operative field.  The suprapatellar approach was used.  Incision was made 2 fingerbreadths proximal to the superior pole of patella tendon, quad tendon was incised.  The cannulated device was placed between the trochlea and the patella.  Starting point was visualized in the AP and lateral planes under fluoroscopy.  Guidepin was placed.  The metal locking cannula was placed and kept in position using 2 pins.  At this time, proximal reaming was performed and a guidepin was placed.  The guidepin was guided down across the fracture site which was reduced with traction and manipulation.  Guidepin was taken to the physeal  scar. Reaming was performed up to 10.5 mm.  Nail was then placed in good position across the fracture site with good reduction of the fracture. Rotational alignment was maintained.  At this time, 2 proximal interlocking screws were placed and 2 distal interlocking screws were placed.  All-in-all fracture reduction looked good with restoration of length and rotation.  At this time, all incisions were irrigated.  Quad tendon was closed using #1 Vicryl suture followed by interrupted inverted 0 Vicryl suture, 2-0 Vicryl suture, and 3-0 Monocryl with Steri- Strips.  The screw incisions were irrigated and closed using 2-0 Vicryl and 3-0 nylon.  The fracture was held, reduced with a Darrick Penna clamp. This did assist to a moderate degree in maintenance of reduction. Attention was then directed towards the lateral malleolus and lateral fibular shaft fracture.  Incision was made.  Superficial peroneal nerve branch was encountered, identified, dissected, and protected.  It did require both anterior and posterior retraction.  The shaft fracture was identified and a lag screw was placed front to back and then, a 6-hole plate was then placed in bridging fashion, 2 holes proximal, 2 holes distal to the fracture.  This gave a good reduction of the fracture. Syndesmosis was stressed at this time, found to  be stable.  Posterior malleolar fragment nondisplaced.  At this time, thorough irrigation was performed of the ankle incision and then, it was closed using 0 Vicryl suture, 2-0 Vicryl suture, and 3-0 nylon suture.  Solution of Marcaine, morphine, clonidine placed into all the incisions.  The patient tolerated the procedure well without immediate complication. Transferred to the recovery room in stable condition.  A well-padded posterior splint was applied.  Impervious dressings also applied underneath the Ace wrap and the 5 x 30 __________ x1 posterior splint.     Burnard BuntingG. Scott Dean, M.D.     GSD/MEDQ   D:  07/10/2017  T:  07/10/2017  Job:  [id number]

## 2017-07-10 NOTE — H&P (Signed)
Hailey Carter is an 41 y.o. female.   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  She was ice skating last night when she fell.  There is no loss of consciousness.  She reports right leg pain.  Patient does not have a history of DVT or pulmonary embolism.  She is here with her husband.  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 No pertinent past medical history.  Past Surgical History:  Procedure Laterality Date  . CHOLECYSTECTOMY    . COLONOSCOPY    . ECTOPIC PREGNANCY SURGERY    . WISDOM TOOTH EXTRACTION      No family history on file. Social History:  reports that she has never smoked. She has never used smokeless tobacco. She reports that she does not drink alcohol or use drugs.  Allergies: No Known Allergies   (Not in a hospital admission)  Results for orders placed or performed during the hospital encounter of 07/09/17 (from the past 48 hour(s))  Comprehensive metabolic panel     Status: Abnormal   Collection Time: 07/09/17  6:21 PM  Result Value Ref Range   Sodium 142 135 - 145 mmol/L   Potassium 4.2 3.5 - 5.1 mmol/L   Chloride 111 101 - 111 mmol/L   CO2 21 (L) 22 - 32 mmol/L   Glucose, Bld 110 (H) 65 - 99 mg/dL   BUN 14 6 - 20 mg/dL   Creatinine, Ser 0.87 0.44 - 1.00 mg/dL   Calcium 8.6 (L) 8.9 - 10.3 mg/dL   Total Protein 6.8 6.5 - 8.1 g/dL   Albumin 4.0 3.5 - 5.0 g/dL   AST 27 15 - 41 U/L   ALT 15 14 - 54 U/L   Alkaline Phosphatase 63 38 - 126 U/L   Total Bilirubin 0.2 (L) 0.3 - 1.2 mg/dL   GFR calc non Af Amer >60 >60 mL/min   GFR calc Af Amer >60 >60 mL/min    Comment: (NOTE) The eGFR has been calculated using the CKD EPI equation. This calculation has not been validated in all clinical situations. eGFR's persistently <60 mL/min signify possible Chronic Kidney Disease.    Anion gap 10 5 - 15   CBC     Status: Abnormal   Collection Time: 07/09/17  6:21 PM  Result Value Ref Range   WBC 11.7 (H) 4.0 - 10.5 K/uL   RBC 4.29 3.87 - 5.11 [name]/uL   Hemoglobin 13.9 12.0 - 15.0 g/dL   HCT 39.4 36.0 - 46.0 %   MCV 91.8 78.0 - 100.0 fL   MCH 32.4 26.0 - 34.0 pg   MCHC 35.3 30.0 - 36.0 g/dL   RDW 12.9 11.5 - 15.5 %   Platelets 170 150 - 400 K/uL  I-Stat beta hCG blood, ED     Status: None   Collection Time: 07/09/17  6:46 PM  Result Value Ref Range   I-stat hCG, quantitative <5.0 <5 mIU/mL   Comment 3            Comment:   GEST. AGE      CONC.  (mIU/mL)   <=1 WEEK        5 - 50     2 WEEKS       50 - 500     3 WEEKS         100 - 10,000     4 WEEKS     1,000 - 30,000        FEMALE AND NON-PREGNANT FEMALE:     LESS THAN 5 mIU/mL    Dg Tibia/fibula Right  Result Date: 07/09/2017 CLINICAL DATA:  Fell at [employer] link today; pain in right tib fib, more on distal right tib fib; no previous injury EXAM: RIGHT TIBIA AND FIBULA - 2 VIEW COMPARISON:  None. FINDINGS: Significantly displaced fracture of the distal right tibia, located within the distal diaphysis, with anterior displacement of the proximal fracture fragment and overriding of the fracture fragments. Displaced/comminuted fracture of the proximal right fibula. Additional slightly displaced fracture within the distal right fibula. Ankle mortise remains grossly symmetric. Alignment at the right knee appears normal. IMPRESSION: 1. Significantly displaced fracture of the distal right tibia, at the level of the distal diaphysis, with anterior displacement of the proximal fracture fragment and overriding of the fracture fragments. 2. Displaced/comminuted fracture of the proximal right fibula, at the level the proximal metaphysis. 3. Slightly displaced obliquely oriented fracture of the distal right fibula, at the level of the distal metadiaphysis. Electronically Signed   By: Franki Cabot M.D.   On: 07/09/2017 19:31    Review of Systems   Musculoskeletal: Positive for joint pain.  All other systems reviewed and are negative.   Blood pressure 118/84, pulse 76, temperature 98.2 F (36.8 C), temperature source Oral, resp. rate 18, height 5' 9" (1.753 m), weight 215 lb (97.5 kg), last menstrual period 06/16/2017, SpO2 98 %. Physical Exam  Constitutional: She appears well-developed.  HENT:  Head: Normocephalic.  Eyes: Pupils are equal, round, and reactive to light.  Neck: Normal range of motion.  Cardiovascular: Normal rate.   Respiratory: Effort normal.  Neurological: She is alert.  Skin: Skin is warm.  Psychiatric: She has a normal mood and affect.   patient's right lower extremity is splinted.  Some swelling is present but compartments feel soft to palpation.  No pain with passive toe dorsiflexion plantar flexion.  Sensation is intact on the dorsal and plantar aspect of the foot.  The foot is perfused.  No swelling in the right knee.  Left lower extremity demonstrates good range of motion knee ankle and hip.  2 noted on the left toe.  Bilateral upper extremity range of motion is intact.  Assessment/Plan Impression is right tib-fib fracture.  The fibular fracture is segmental.  We will need to evaluate the mortise at the time of fixation of the tibia.  Plan intramedullary nailing of the right tibia.  This may require also fixation of the fibula fracture which in this case would likely be lax screw fixation.  Patient understands the risks and benefits of surgical intervention.  These include but are not limited to infection or vessel damage knee stiffness as well as potential for blood clot formation as well as a period of delayed weightbearing.  Patient understands the risks and benefits and wishes to proceed.  All questions answered.  Anderson Malta, MD 07/10/2017, 6:29 AM

## 2017-07-10 NOTE — Transfer of Care (Signed)
Immediate Anesthesia Transfer of Care Note  Patient: Hailey HousekeeperDiana S Coleman- Dittus  Procedure(s) Performed: Procedure(s): INTRAMEDULLARY (IM) NAIL TIBIAL ORIF ANKLE FRACTURE (Right)  Patient Location: PACU  Anesthesia Type:General  Level of Consciousness: awake, alert , oriented and patient cooperative  Airway & Oxygen Therapy: Patient Spontanous Breathing and Patient connected to nasal cannula oxygen  Post-op Assessment: Report given to RN  Post vital signs: Reviewed  Last Vitals: 111/74, 85, 18, 96% Vitals:   07/10/17 0952 07/10/17 1520  BP: 102/89 111/74  Pulse: 83 88  Resp: 15 16  Temp:  36.7 C    Last Pain:  Vitals:   07/10/17 1520  TempSrc:   PainSc: (P) 0-No pain         Complications: No apparent anesthesia complications

## 2017-07-10 NOTE — Brief Op Note (Signed)
07/09/2017 - 07/10/2017  3:26 PM  PATIENT:  Adonis Housekeeperiana S Coleman- Carrasco  41 y.o. female  PRE-OPERATIVE DIAGNOSIS:  right tibia/fibula fracture  POST-OPERATIVE DIAGNOSIS:  right tibia/fibula fracture and right ankle fracture  PROCEDURE:  Procedure(s): INTRAMEDULLARY (IM) NAIL TIBIAL ORIF ANKLE FRACTURE  SURGEON:  Surgeon(s): Cammy Copaean, Scott Tahjae Clausing, MD  ASSISTANT: Patrick Jupiterarla Bethune rnfa  ANESTHESIA:   general  EBL: 150 ml    Total I/O In: 1000 [I.V.:1000] Out: 435 [Urine:360; Blood:75]  BLOOD ADMINISTERED: none  DRAINS: none   LOCAL MEDICATIONS USED:  Marcaine mso4 clonidine  SPECIMEN:  No Specimen  COUNTS:  YES  TOURNIQUET:  * No tourniquets in log *  DICTATION: .Other Dictation: Dictation Number [id number]  PLAN OF CARE: Admit for overnight observation  PATIENT DISPOSITION:  PACU - hemodynamically stable

## 2017-07-10 NOTE — Anesthesia Procedure Notes (Signed)
Procedure Name: Intubation Date/Time: 07/10/2017 11:47 AM Performed by: Clearnce Sorrel Pre-anesthesia Checklist: Patient identified, Emergency Drugs available, Suction available, Patient being monitored and Timeout performed Patient Re-evaluated:Patient Re-evaluated prior to induction Oxygen Delivery Method: Circle system utilized Preoxygenation: Pre-oxygenation with 100% oxygen Induction Type: IV induction Ventilation: Mask ventilation without difficulty Laryngoscope Size: Mac and 3 Grade View: Grade I Tube type: Oral Number of attempts: 1 Airway Equipment and Method: Stylet Placement Confirmation: ETT inserted through vocal cords under direct vision,  positive ETCO2 and breath sounds checked- equal and bilateral Secured at: 22 cm Tube secured with: Tape Dental Injury: Teeth and Oropharynx as per pre-operative assessment

## 2017-07-10 NOTE — Progress Notes (Signed)
Ok for [employer] tomorrow after PT rx on chart tdwb rle ok

## 2017-07-11 DIAGNOSIS — Z6831 Body mass index (BMI) 31.0-31.9, adult: Secondary | ICD-10-CM | POA: Diagnosis not present

## 2017-07-11 DIAGNOSIS — S82839A Other fracture of upper and lower end of unspecified fibula, initial encounter for closed fracture: Secondary | ICD-10-CM | POA: Diagnosis present

## 2017-07-11 DIAGNOSIS — Y9321 Activity, ice skating: Secondary | ICD-10-CM | POA: Diagnosis not present

## 2017-07-11 DIAGNOSIS — S82251A Displaced comminuted fracture of shaft of right tibia, initial encounter for closed fracture: Secondary | ICD-10-CM | POA: Diagnosis present

## 2017-07-11 DIAGNOSIS — S82201A Unspecified fracture of shaft of right tibia, initial encounter for closed fracture: Secondary | ICD-10-CM | POA: Diagnosis present

## 2017-07-11 DIAGNOSIS — Z7982 Long term (current) use of aspirin: Secondary | ICD-10-CM | POA: Diagnosis not present

## 2017-07-11 DIAGNOSIS — E669 Obesity, unspecified: Secondary | ICD-10-CM | POA: Diagnosis present

## 2017-07-11 DIAGNOSIS — Z79899 Other long term (current) drug therapy: Secondary | ICD-10-CM | POA: Diagnosis not present

## 2017-07-11 DIAGNOSIS — S82251D Displaced comminuted fracture of shaft of right tibia, subsequent encounter for closed fracture with routine healing: Secondary | ICD-10-CM

## 2017-07-11 DIAGNOSIS — S82891A Other fracture of right lower leg, initial encounter for closed fracture: Secondary | ICD-10-CM | POA: Diagnosis present

## 2017-07-11 NOTE — Progress Notes (Signed)
     Subjective: 1 Day Post-Op Procedure(s) (LRB): INTRAMEDULLARY (IM) NAIL TIBIAL ORIF ANKLE FRACTURE (Right) Awake,alert pleasant 41 year old mildly obese female. In good spirits. Discomfort about the medial and superior pole of patella and lateral proximal leg below knee. Tolerating po meds. Takes normally 81 mg of Aspirin daily.  Patient reports pain as moderate.    Objective:   VITALS:  Temp:  [97.8 F (36.6 C)-99.3 F (37.4 C)] 98.7 F (37.1 C) (07/21 0431) Pulse Rate:  [70-95] 73 (07/21 0431) Resp:  [10-16] 14 (07/20 1605) BP: (91-114)/(48-74) 95/48 (07/21 0431) SpO2:  [93 %-98 %] 94 % (07/21 0431)  Neurologically intact ABD soft Neurovascular intact Sensation intact distally Intact pulses distally Dorsiflexion/Plantar flexion intact Incision: dressing C/D/I and no drainage Compartment soft   LABS  Recent Labs  07/09/17 1821  HGB 13.9  WBC 11.7*  PLT 170    Recent Labs  07/09/17 1821  NA 142  K 4.2  CL 111  CO2 21*  BUN 14  CREATININE 0.87  GLUCOSE 110*   No results for input(s): LABPT, INR in the last 72 hours.   Assessment/Plan: 1 Day Post-Op Procedure(s) (LRB): INTRAMEDULLARY (IM) NAIL TIBIAL ORIF ANKLE FRACTURE (Right)  Advance diet Up with therapy  Discussed with Dr. August Saucerean earlier today.  Therapy indicates that she had blood pressure drop with upright limiting her session. Not able to try stairs just yet. Will plan to keep her until she is safe for stairs as she needs to Enter house up 4-5 stairs safely. Encouraged toe movement. No to weight bear in large amounts.  Kerrin ChampagneJames E Nitka 07/11/2017, 10:30 AM Patient ID: Hailey Carter, female   DOB: 07/03/1976, 41 y.o.   MRN: [id number]

## 2017-07-11 NOTE — Progress Notes (Signed)
Physical Therapy Treatment Patient Details Name: Hailey Carter MRN: [id number] DOB: 02-22-1976 Today's Date: 07/11/2017    History of Present Illness Pt is a 41 y.o. female who sustained R tib/fib fx ice skating. She underwent IM nailing 07-10-17.    PT Comments    Pt continues to present with symptomatic hypotension. She reported dizziness sitting EOB had decreased since AM session allowing progression with transfers. Upon pivot transfer, palor noted and pt with c/o increased dizziness and lightheadedness. Pt safely positioned reclined in bedside chair. Recommend additional night stay in hospital to ensure safety with mobility prior to d/c home. Pt has 4 steps to enter home. Verbally discussed with pt and spouse the 2 safest methods for ascending steps: bumping up on her bottom or 2-person assist of family with pt in w/c.   Follow Up Recommendations  No PT follow up;Supervision for mobility/OOB     Equipment Recommendations  Rolling walker with 5" wheels    Recommendations for Other Services       Precautions / Restrictions Precautions Precautions: Fall;Other (comment) Precaution Comments: Pt hypotensive. Restrictions Weight Bearing Restrictions: Yes RLE Weight Bearing: Touchdown weight bearing    Mobility  Bed Mobility Overal bed mobility: Needs Assistance Bed Mobility: Supine to Sit;Sit to Supine     Supine to sit: Min assist;HOB elevated Sit to supine: Min assist   General bed mobility comments: +rail, assist with RLE  Transfers Overall transfer level: Needs assistance Equipment used: Rolling walker (2 wheeled) Transfers: Sit to/from [employer] Sit to Stand: Min assist Stand pivot transfers: Min assist       General transfer comment: Low BP persists limiting pt's ability to participate in therapy. pt able to tolerate SPT with RW bed to recliner.  Ambulation/Gait             General Gait Details: unable due to symptomatic  hypotension.   Stairs            Wheelchair Mobility    Modified Rankin (Stroke Patients Only)       Balance                                            Cognition Arousal/Alertness: Awake/alert Behavior During Therapy: WFL for tasks assessed/performed Overall Cognitive Status: Within Functional Limits for tasks assessed                                        Exercises      General Comments        Pertinent Vitals/Pain Pain Assessment: 0-10 Pain Score: 5  Pain Location: R knee Pain Descriptors / Indicators: Burning;Aching Pain Intervention(s): Repositioned;Limited activity within patient's tolerance;Monitored during session    Home Living Family/patient expects to be discharged to:: Private residence Living Arrangements: Spouse/significant other Available Help at Discharge: Family;Available 24 hours/day Type of Home: House Home Access: Stairs to enter Entrance Stairs-Rails: Right;Left;Can reach both Home Layout: One level Home Equipment: None Additional Comments: Pt has access to RW, crutches, BSC, and wheelchair from family members.    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   PT Goals (current goals can now be found in the care plan section) Acute Rehab PT Goals Patient Stated Goal:  home PT Goal Formulation: With patient Time For Goal Achievement: 07/18/17 Potential to Achieve Goals: Good Progress towards PT goals: Progressing toward goals    Frequency    Min 6X/week      PT Plan Current plan remains appropriate    Co-evaluation              AM-PAC PT "6 Clicks" Daily Activity  Outcome Measure  Difficulty turning over in bed (including adjusting bedclothes, sheets and blankets)?: A Little Difficulty moving from lying on back to sitting on the side of the bed? : Total Difficulty sitting down on and standing up from a chair with arms (e.g.,  wheelchair, bedside commode, etc,.)?: Total Help needed moving to and from a bed to chair (including a wheelchair)?: A Little Help needed walking in hospital room?: A Lot Help needed climbing 3-5 steps with a railing? : A Lot 6 Click Score: 12    End of Session Equipment Utilized During Treatment: Gait belt Activity Tolerance: Treatment limited secondary to medical complications (Comment) (hypotension) Patient left: in chair;with call bell/phone within reach;with family/visitor present Nurse Communication: Mobility status PT Visit Diagnosis: Unsteadiness on feet (R26.81);Pain Pain - Right/Left: Right Pain - part of body: Leg     Time: 1142-1203 PT Time Calculation (min) (ACUTE ONLY): 21 min  Charges:  $Therapeutic Activity: 8-22 mins                    G Codes:  Functional Assessment Tool Used: AM-PAC 6 Clicks Basic Mobility Functional Limitation: Mobility: Walking and moving around Mobility: Walking and Moving Around Current Status ([id number](G8978): At least 60 percent but less than 80 percent impaired, limited or restricted Mobility: Walking and Moving Around Goal Status [phone]): At least 20 percent but less than 40 percent impaired, limited or restricted    Aida RaiderWendy Thurmond Hildebran, PT  Office # [phone] Pager [phone]    Ilda FoilGarrow, Joab Carden Rene 07/11/2017, 12:39 PM

## 2017-07-11 NOTE — Evaluation (Signed)
Physical Therapy Evaluation Patient Details Name: Hailey Carter MRN: [id number] DOB: Sep 08, 1976 Today's Date: 07/11/2017   History of Present Illness  Pt is a 41 y.o. female who sustained R tib/fib fx ice skating. She underwent IM nailing 07-10-17.  Clinical Impression  Pt admitted with above diagnosis. Pt currently with functional limitations due to the deficits listed below (see PT Problem List). On eval, pt required min assist bed mobility. Pt unable to progress beyond EOB due to hypotension. BP 95/57.  Pt will benefit from skilled PT to increase their independence and safety with mobility to allow discharge to the venue listed below.  Pt has 4 steps to enter her home.      Follow Up Recommendations No PT follow up;Supervision for mobility/OOB    Equipment Recommendations       Recommendations for Other Services       Precautions / Restrictions Precautions Precautions: Fall Restrictions Weight Bearing Restrictions: Yes RLE Weight Bearing: Touchdown weight bearing      Mobility  Bed Mobility Overal bed mobility: Needs Assistance Bed Mobility: Supine to Sit;Sit to Supine     Supine to sit: Min assist;HOB elevated Sit to supine: Min assist   General bed mobility comments: +rail, assist with RLE  Transfers                 General transfer comment: Pt sat EOB x 5 minutes. Unable to progress beyond EOB due to dizziness and nausea. BP 95/57 sitting EOB. Pt returned to supine.  Ambulation/Gait             General Gait Details: unable due to symptomatic hypotension.  Stairs            Wheelchair Mobility    Modified Rankin (Stroke Patients Only)       Balance                                             Pertinent Vitals/Pain Pain Assessment: 0-10 Pain Score: 5  Pain Location: R knee Pain Descriptors / Indicators: Burning;Aching Pain Intervention(s): Limited activity within patient's tolerance;Monitored during session     Home Living Family/patient expects to be discharged to:: Private residence Living Arrangements: Spouse/significant other Available Help at Discharge: Family;Available 24 hours/day Type of Home: House Home Access: Stairs to enter Entrance Stairs-Rails: Right;Left;Can reach both Entrance Stairs-Number of Steps: 4 Home Layout: One level Home Equipment: None Additional Comments: Pt has access to RW, crutches, BSC, and wheelchair from family members.    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     Hand Dominance        Extremity/Trunk Assessment                Communication   Communication: No difficulties  Cognition Arousal/Alertness: Awake/alert Behavior During Therapy: WFL for tasks assessed/performed Overall Cognitive Status: Within Functional Limits for tasks assessed                                        General Comments      Exercises     Assessment/Plan    PT Assessment Patient needs continued PT services  PT Problem List Decreased activity tolerance;Decreased balance;Pain;Decreased knowledge of  use of DME;Decreased mobility;Decreased knowledge of precautions       PT Treatment Interventions DME instruction;Gait training;Stair training;Functional mobility training;Therapeutic activities;Therapeutic exercise;Balance training;Patient/family education;Wheelchair mobility training    PT Goals (Current goals can be found in the Care Plan section)  Acute Rehab PT Goals Patient Stated Goal: home PT Goal Formulation: With patient Time For Goal Achievement: 07/18/17 Potential to Achieve Goals: Good    Frequency Min 6X/week   Barriers to discharge Inaccessible home environment      Co-evaluation               AM-PAC PT "6 Clicks" Daily Activity  Outcome Measure Difficulty turning over in bed (including adjusting bedclothes, sheets and blankets)?: A Little Difficulty  moving from lying on back to sitting on the side of the bed? : Total Difficulty sitting down on and standing up from a chair with arms (e.g., wheelchair, bedside commode, etc,.)?: Total Help needed moving to and from a bed to chair (including a wheelchair)?: A Lot Help needed walking in hospital room?: A Lot Help needed climbing 3-5 steps with a railing? : A Lot 6 Click Score: 11    End of Session Equipment Utilized During Treatment: Gait belt Activity Tolerance: Treatment limited secondary to medical complications (Comment) (hypotension) Patient left: in bed;with family/visitor present;with call bell/phone within reach Nurse Communication: Mobility status PT Visit Diagnosis: Unsteadiness on feet (R26.81);Pain Pain - Right/Left: Right Pain - part of body: Leg    Time: [id number] PT Time Calculation (min) (ACUTE ONLY): 30 min   Charges:   PT Evaluation $PT Eval Moderate Complexity: 1 Procedure PT Treatments $Therapeutic Activity: 8-22 mins   PT G Codes:   PT G-Codes **NOT FOR INPATIENT CLASS** Functional Assessment Tool Used: AM-PAC 6 Clicks Basic Mobility Functional Limitation: Mobility: Walking and moving around Mobility: Walking and Moving Around Current Status ([id number](G8978): At least 60 percent but less than 80 percent impaired, limited or restricted Mobility: Walking and Moving Around Goal Status [phone](G8979): At least 20 percent but less than 40 percent impaired, limited or restricted    Hailey Carter, PT  Office # [phone] Pager [phone]   Hailey Carter, Hailey Carter 07/11/2017, 10:03 AM

## 2017-07-12 NOTE — Care Management Note (Signed)
Case Management Note  Patient Details  Name: Hailey HousekeeperDiana S Coleman- Delcarlo MRN: [id number] Date of Birth: 03/31/1976  Subjective/Objective: spoke with spouse who has chosen [facility] to provide HHPT. [name] , with [facility] has accepted referral. Pt and spouse awaiting DME. ([name])                   Action/Plan:CM will sign off for now but will be available should additional discharge needs arise or disposition change.    Expected Discharge Date:  07/11/17               Expected Discharge Plan:  Home/Self Care  In-House Referral:     Discharge planning Services  CM Consult  Post Acute Care Choice:  Durable Medical Equipment, Home Health Choice offered to:  Patient  DME Arranged:  Lightweight manual wheelchair with seat cushion, Walker rolling DME Agency:  [facility]  HH Arr[facility]:  [facility]  [facility]  Status of Service:  Completed, signed off  If discussed at Long Length of Stay Meetings, dates discussed:    Additional Comments:  Yvone NeuCrutchfield, Jazsmin Couse M, RN 07/12/2017, 10:36 AM

## 2017-07-12 NOTE — Discharge Summary (Signed)
Physician Discharge Summary  Patient ID: PATRICE MATTHEW- Hoelzer MRN: [id number] DOB/AGE: May 20, 1976 41 y.o.  Admit date: 07/09/2017 Discharge date: 07/12/2017  Admission Diagnoses:  Active Problems:   Tibia/fibula fracture, right, closed, initial encounter   Tibial fracture   Displaced comminuted fracture of shaft of right tibia, subsequent encounter for closed fracture with routine healing   Discharge Diagnoses:  Same  Surgeries: Procedure(s): INTRAMEDULLARY (IM) NAIL TIBIAL ORIF ANKLE FRACTURE on 07/09/2017 - 07/10/2017   Consultants:   Discharged Condition: Stable  Hospital Course: MALAJAH OCEGUERA- Henk is an 41 y.o. female who was admitted 07/09/2017 with a chief complaint of  Chief Complaint  Patient presents with  . Leg Injury    right  , and found to have a diagnosis of Right tib-fib fracture and right ankle fracture.  They were brought to the operating room on 07/09/2017 - 07/10/2017 and underwent the above named procedures.  She tolerated the procedures well.  She was mobilized with physical therapy touchdown weightbearing only for transfers.  Pain was controlled on oral pain medicine at the time of discharge.  She is discharged home in good condition on Xarelto for DVT prophylaxis.  She'll follow-up with me in 1 week.  Knee range of motion is encouraged.  Antibiotics given:  Anti-infectives    Start     Dose/Rate Route Frequency Ordered Stop   07/10/17 1800  ceFAZolin (ANCEF) IVPB 2g/100 mL premix     2 g 200 mL/hr over 30 Minutes Intravenous Every 6 hours 07/10/17 1629 07/11/17 0116   07/10/17 1100  ceFAZolin (ANCEF) IVPB 2g/100 mL premix     2 g 200 mL/hr over 30 Minutes Intravenous On call to O.R. 07/10/17 1047 07/10/17 1150   07/10/17 1052  ceFAZolin (ANCEF) 2-4 GM/100ML-% IVPB    Comments:  Rosenberger, Meredit: cabinet override      07/10/17 1052 07/10/17 1150    .  Recent vital signs:  Vitals:   07/12/17 0331 07/12/17 1329  BP: 110/64 115/75  Pulse: 79 88   Resp:  17  Temp: (!) 100.5 F (38.1 C) 99.1 F (37.3 C)    Recent laboratory studies:  Results for orders placed or performed during the hospital encounter of 07/09/17  Comprehensive metabolic panel  Result Value Ref Range   Sodium 142 135 - 145 mmol/L   Potassium 4.2 3.5 - 5.1 mmol/L   Chloride 111 101 - 111 mmol/L   CO2 21 (L) 22 - 32 mmol/L   Glucose, Bld 110 (H) 65 - 99 mg/dL   BUN 14 6 - 20 mg/dL   Creatinine, Ser 7.82 0.44 - 1.00 mg/dL   Calcium 8.6 (L) 8.9 - 10.3 mg/dL   Total Protein 6.8 6.5 - 8.1 g/dL   Albumin 4.0 3.5 - 5.0 g/dL   AST 27 15 - 41 U/L   ALT 15 14 - 54 U/L   Alkaline Phosphatase 63 38 - 126 U/L   Total Bilirubin 0.2 (L) 0.3 - 1.2 mg/dL   GFR calc non Af Amer >60 >60 mL/min   GFR calc Af Amer >60 >60 mL/min   Anion gap 10 5 - 15  CBC  Result Value Ref Range   WBC 11.7 (H) 4.0 - 10.5 K/uL   RBC 4.29 3.87 - 5.11 [name]/uL   Hemoglobin 13.9 12.0 - 15.0 g/dL   HCT 95.6 21.3 - 08.6 %   MCV 91.8 78.0 - 100.0 fL   MCH 32.4 26.0 - 34.0 pg   MCHC 35.3 30.0 -  36.0 g/dL   RDW 16.1 09.6 - 04.5 %   Platelets 170 150 - 400 K/uL  I-Stat beta hCG blood, ED  Result Value Ref Range   I-stat hCG, quantitative <5.0 <5 mIU/mL   Comment 3            Discharge Medications:   Allergies as of 07/12/2017   No Known Allergies     Medication List    STOP taking these medications   aspirin EC 81 MG tablet     TAKE these medications   acetaminophen 325 MG tablet Commonly known as:  TYLENOL Take 650 mg by mouth every 6 (six) hours as needed for moderate pain. Notes to patient:  Oxycodone 5mg  tablets, take 1-2 tablets every 4 hours as needed for pain. Last given 7/22 at 1pm.   Baclofen 5mg  tablets, take 1 tablet every 8 hours as needed for muscle spasms   (See handwritten prescriptions)   folic acid 1 MG tablet Commonly known as:  FOLVITE Take 4 mg by mouth daily.   montelukast 10 MG tablet Commonly known as:  SINGULAIR Take 10 mg by mouth at bedtime.    multivitamin with minerals Tabs tablet Take 1 tablet by mouth daily.   sertraline 50 MG tablet Commonly known as:  ZOLOFT Take 50 mg by mouth daily.            Durable Medical Equipment        Start     Ordered   07/12/17 1241  For home use only DME wheelchair cushion (seat and back)  Once    Comments:  Wheel chair with adjustable leg rests and foot rests, adjustable back rest and removeable armrests.   07/12/17 1241   07/12/17 1240  For home use only DME Walker rolling  Once    Question:  Patient needs a walker to treat with the following condition  Answer:  Displaced comminuted fracture of shaft of right tibia, subsequent encounter for closed fracture with routine healing   07/12/17 1241   07/12/17 1030  For home use only DME lightweight manual wheelchair with seat cushion  Once    Comments:  Patient suffers from immobility which impairs their ability to perform daily activities like prolonged standing in the home.  A RW will not resolve  issue with performing activities of daily living. A wheelchair will allow patient to safely perform daily activities. Patient is not able to propel themselves in the home using a standard weight wheelchair due to NWB. Patient can self propel in the lightweight wheelchair.  Accessories: elevating leg rests (ELRs), wheel locks, extensions and anti-tippers.   07/12/17 1030   07/12/17 1025  For home use only DME Walker rolling  Once    Question:  Patient needs a walker to treat with the following condition  Answer:  Fracture of right tibia and fibula   07/12/17 1029      Diagnostic Studies: Dg Tibia/fibula Right  Result Date: 07/10/2017 CLINICAL DATA:  Tibial and fibular fractures EXAM: DG C-ARM 61-120 MIN; RIGHT TIBIA AND FIBULA - 2 VIEW COMPARISON:  07/09/2017 FLUOROSCOPY TIME:  Fluoroscopy Time:  3 minutes 12 seconds Radiation Exposure Index (if provided by the fluoroscopic device): Not available Number of Acquired Spot Images: 6 FINDINGS:  Medullary rod is noted within the tibia with proximal and distal fixation screws. Fixation sideplate along the distal fibula is noted. Fracture fragments are in near anatomic alignment. IMPRESSION: ORIF of right tibial and fibular fractures Electronically Signed  By: Alcide Clever M.D.   On: 07/10/2017 15:25   Dg Tibia/fibula Right  Result Date: 07/09/2017 CLINICAL DATA:  Larey Seat at [place] link today; pain in right tib fib, more on distal right tib fib; no previous injury EXAM: RIGHT TIBIA AND FIBULA - 2 VIEW COMPARISON:  None. FINDINGS: Significantly displaced fracture of the distal right tibia, located within the distal diaphysis, with anterior displacement of the proximal fracture fragment and overriding of the fracture fragments. Displaced/comminuted fracture of the proximal right fibula. Additional slightly displaced fracture within the distal right fibula. Ankle mortise remains grossly symmetric. Alignment at the right knee appears normal. IMPRESSION: 1. Significantly displaced fracture of the distal right tibia, at the level of the distal diaphysis, with anterior displacement of the proximal fracture fragment and overriding of the fracture fragments. 2. Displaced/comminuted fracture of the proximal right fibula, at the level the proximal metaphysis. 3. Slightly displaced obliquely oriented fracture of the distal right fibula, at the level of the distal metadiaphysis. Electronically Signed   By: Bary Richard M.D.   On: 07/09/2017 19:31   Dg Ankle Right Port  Result Date: 07/10/2017 CLINICAL DATA:  Status post IM nail right ankle ORIF EXAM: PORTABLE RIGHT ANKLE - 2 VIEW COMPARISON:  Intraop right ankle radiographs 07/10/2017 FINDINGS: Lateral plate and screw fixation of a distal fibular fracture, in new anatomic alignment and position. IM nail with two distal interlocking screws transfixing a distal tibial shaft fracture, minimally displaced. Mild soft tissue swelling. IMPRESSION: Status post ORIF of  distal tibial and fibular fracture, as above. Electronically Signed   By: Charline Bills M.D.   On: 07/10/2017 17:40   Dg C-arm 61-120 Min  Result Date: 07/10/2017 CLINICAL DATA:  Tibial and fibular fractures EXAM: DG C-ARM 61-120 MIN; RIGHT TIBIA AND FIBULA - 2 VIEW COMPARISON:  07/09/2017 FLUOROSCOPY TIME:  Fluoroscopy Time:  3 minutes 12 seconds Radiation Exposure Index (if provided by the fluoroscopic device): Not available Number of Acquired Spot Images: 6 FINDINGS: Medullary rod is noted within the tibia with proximal and distal fixation screws. Fixation sideplate along the distal fibula is noted. Fracture fragments are in near anatomic alignment. IMPRESSION: ORIF of right tibial and fibular fractures Electronically Signed   By: Alcide Clever M.D.   On: 07/10/2017 15:25    Disposition: 01-Home or Self Care  Discharge Instructions    Call MD / Call 911    Complete by:  As directed    If you experience chest pain or shortness of breath, CALL 911 and be transported to the hospital emergency room.  If you develope a fever above 101 F, pus (white drainage) or increased drainage or redness at the wound, or calf pain, call your surgeon's office.   Call MD / Call 911    Complete by:  As directed    If you experience chest pain or shortness of breath, CALL 911 and be transported to the hospital emergency room.  If you develope a fever above 101 F, pus (white drainage) or increased drainage or redness at the wound, or calf pain, call your surgeon's office.   Call MD / Call 911    Complete by:  As directed    If you experience chest pain or shortness of breath, CALL 911 and be transported to the hospital emergency room.  If you develope a fever above 101 F, pus (white drainage) or increased drainage or redness at the wound, or calf pain, call your surgeon's office.   Constipation  Prevention    Complete by:  As directed    Drink plenty of fluids.  Prune juice may be helpful.  You may use a stool  softener, such as Colace (over the counter) 100 mg twice a day.  Use MiraLax (over the counter) for constipation as needed.   Constipation Prevention    Complete by:  As directed    Drink plenty of fluids.  Prune juice may be helpful.  You may use a stool softener, such as Colace (over the counter) 100 mg twice a day.  Use MiraLax (over the counter) for constipation as needed.   Constipation Prevention    Complete by:  As directed    Drink plenty of fluids.  Prune juice may be helpful.  You may use a stool softener, such as Colace (over the counter) 100 mg twice a day.  Use MiraLax (over the counter) for constipation as needed.   Diet - low sodium heart healthy    Complete by:  As directed    Diet - low sodium heart healthy    Complete by:  As directed    Diet - low sodium heart healthy    Complete by:  As directed    Discharge instructions    Complete by:  As directed    Elevate leg Okay to touchdown weight-bear for transfers only right lower extremity Return to clinic in a week for splint removal and dressing change.  Keep splint and dressing dry until then Okay to bend knee   Discharge instructions    Complete by:  As directed    Keep leg splint and dressing dry. May use water impervious bag or cast bag and tub chair to shower Tape the top of bag to skin to avoid moisture soaking the dressing on the leg. If short of breath or productive cough or chest pain with breathing, call an ambulance and proceed to the Emergency Room for evaluation. Call if there is odor or saturation of dressing or worsening pain not controlled with medications. Call if fever greater than 101.5. Use crutches or walker no weight bearing on the right fracture leg may just touch the foot to the ground to balance. Please follow up with an appointment with Dr. August Saucerean  5-6 days from the time of surgery. Elevate as often as possible during the first week after surgery gradually increasing the time the leg is dependent  or down there after. If swelling recurrs then elevate again. Wheel chair for longer distances. Take anticoagulant daily aspirin 325 mg or baby aspirin 81 mg BID.   Driving restrictions    Complete by:  As directed    No driving till Dr. August Saucerean advises that it is safe.   Increase activity slowly as tolerated    Complete by:  As directed    Increase activity slowly as tolerated    Complete by:  As directed    Increase activity slowly as tolerated    Complete by:  As directed    Lifting restrictions    Complete by:  As directed    No lifting for 8 weeks      Follow-up Information    [facility] - Dme Follow up.   Why:  RW and WheelChair will be delivered to your room prior to discharge Contact information: [address] [place] [address] [phone]        [facility]-Home Follow up.   Why:  HHPT has been ordered by your  MD and will be provided by the above agency. A representative will contact you within 24-48 hours  to arrange initial visit.  Contact information: [address] [place] [address] [phone]        Cammy Copa, MD Follow up in 4 day(s).   Specialty:  Orthopedic Surgery Why:  call for appointment Contact information: [address] [place] [address] [phone]            Signed: Burnard Bunting 07/12/2017, 4:43 PM

## 2017-07-12 NOTE — Progress Notes (Signed)
     Subjective: 2 Days Post-Op Procedure(s) (LRB): INTRAMEDULLARY (IM) NAIL TIBIAL ORIF ANKLE FRACTURE (Right) Awake, alert, I feel much better today, no episodes of low blood pressure. PT worked with me and I walked in the hall way. [facility] and this is reasonable. Also recommend a rolling walker and a wheel chair which is reasonable considering the injury and patient's size and conditioning.   Patient reports pain as moderate.    Objective:   VITALS:  Temp:  [98.4 F (36.9 C)-100.5 F (38.1 C)] 100.5 F (38.1 C) (07/22 0331) Pulse Rate:  [77-80] 79 (07/22 0331) BP: (102-110)/(55-65) 110/64 (07/22 0331) SpO2:  [96 %-97 %] 96 % (07/22 0331)  Neurologically intact ABD soft Neurovascular intact Intact pulses distally Dorsiflexion/Plantar flexion intact Incision: dressing C/D/I   LABS  Recent Labs  07/09/17 1821  HGB 13.9  WBC 11.7*  PLT 170    Recent Labs  07/09/17 1821  NA 142  K 4.2  CL 111  CO2 21*  BUN 14  CREATININE 0.87  GLUCOSE 110*   No results for input(s): LABPT, INR in the last 72 hours.   Assessment/Plan: 2 Days Post-Op Procedure(s) (LRB): INTRAMEDULLARY (IM) NAIL TIBIAL ORIF ANKLE FRACTURE (Right)  Advance diet Up with therapy Discharge home with home health  Will place DME for rolling walker and wheel chair. Safe for discharge, probably will need to sit in a w/c and have Two people assist to enter house as she did not demonstrate Capacity to be independent in PT as yet.   Kerrin ChampagneJames E Madigan Rosensteel 07/12/2017, 12:34 PM Patient ID: Hailey Carter- Mallick, female   DOB: 01/15/1976, 41 y.o.   MRN: [id number]

## 2017-07-12 NOTE — Discharge Instructions (Addendum)
° ° °  Keep leg splint and dressing dry. May use water impervious bag or cast bag and tub chair to shower Tape the top of bag to skin to avoid moisture soaking the dressing on the leg. If short of breath or productive cough or chest pain with breathing, call an ambulance and proceed to the Emergency Room for evaluation. Call if there is odor or saturation of dressing or worsening pain not controlled with medications. Call if fever greater than 101.5. Use crutches or walker no weight bearing on the right fracture leg may just touch the foot to the ground to balance. Please follow up with an appointment with Dr. August Saucerean  5-6 days from the time of surgery. Elevate as often as possible during the first week after surgery gradually increasing the time the leg is dependent or down there after. If swelling recurrs then elevate again. Wheel chair for longer distances. Take anticoagulant daily aspirin 325 mg or baby aspirin 81 mg BID.  _______________________________________________________________________________________________  The patient's husband, Luane SchoolSteven Poole, was with the patient in the hospital from 7/19-7/22/2018. Patient is unable to bear weight to her right leg  and will need assistance at home after discharge.  Gildardo CrankerAnita Priddy, RN

## 2017-07-12 NOTE — Care Management Note (Signed)
Case Management Note  Patient Details  Name: Hailey Carter MRN: [id number] Date of Birth: 05/03/1976  Subjective/Objective:                 Patient with order to DC to home today. Chart reviewed. No Home Health or Equipment needs, no unacknowledged Case Management consults or medication needs identified at the time of this note. Plan for DC to home. Spoke w patient, she is waiting to hear back from her husband to see if they have a RW at home. She is pretty sure they do. If she needs one at DC please call Hailey Sabalebbie Mykaila Blunck RN CM at [phone].    Action/Plan:   Expected Discharge Date:  07/11/17               Expected Discharge Plan:  Home/Self Care  In-House Referral:     Discharge planning Services  CM Consult  Post Acute Care Choice:    Choice offered to:     DME Arranged:    DME Agency:     HH Arranged:    HH Agency:     Status of Service:  Completed, signed off  If discussed at [employer] Length of Stay Meetings, dates discussed:    Additional Comments:  Hailey SabalDebbie Lexie Koehl, RN 07/12/2017, 8:30 AM

## 2017-07-12 NOTE — Progress Notes (Signed)
Physical Therapy Treatment Patient Details Name: Hailey Carter- Hagemann MRN: [id number] DOB: June 01, 1976 Today's Date: 07/12/2017    History of Present Illness Pt is a 41 y.o. female who sustained R tib/fib fx ice skating. She underwent IM nailing 07-10-17.    PT Comments    Pt pleasant with stable BP today and able to hop short distance and perform transfers . Pt and spouse educated for [facility] for stairs and DME for DC. Pt would benefit from HHPT to maximize function and safety. Will continue to follow.  BP supine 116/74 BP sitting 109/73 BP standing 115/77  Follow Up Recommendations  Home health PT;Supervision for mobility/OOB     Equipment Recommendations  Rolling walker with 5" wheels;Wheelchair (measurements PT);Wheelchair cushion (measurements PT)    Recommendations for Other Services       Precautions / Restrictions Precautions Precautions: Fall;Other (comment) Restrictions Weight Bearing Restrictions: Yes RLE Weight Bearing: Touchdown weight bearing    Mobility  Bed Mobility Overal bed mobility: Needs Assistance Bed Mobility: Supine to Sit     Supine to sit: Min assist     General bed mobility comments: assist to move RLE, cues for assist with LLE or belt  Transfers Overall transfer level: Needs assistance   Transfers: Sit to/from Stand;Squat Pivot Transfers Sit to Stand: Min guard   Squat pivot transfers: Min guard     General transfer comment: cues for hand placement, pivot bed<>BSC  Ambulation/Gait Ambulation/Gait assistance: Min assist Ambulation Distance (Feet): 30 Feet Assistive device: Rolling walker (2 wheeled) Gait Pattern/deviations: Step-to pattern   Gait velocity interpretation: Below normal speed for age/gender General Gait Details: slow steady gait with chair to follow   Stairs            Wheelchair Mobility    Modified Rankin (Stroke Patients Only)       Balance                                             Cognition Arousal/Alertness: Awake/alert Behavior During Therapy: WFL for tasks assessed/performed Overall Cognitive Status: Within Functional Limits for tasks assessed                                        Exercises      General Comments        Pertinent Vitals/Pain Pain Score: 5  Pain Location: R knee and thigh Pain Descriptors / Indicators: Sore;Aching Pain Intervention(s): Limited activity within patient's tolerance;Repositioned;RN gave pain meds during session;Monitored during session    Home Living                      Prior Function            PT Goals (current goals can now be found in the care plan section) Progress towards PT goals: Progressing toward goals    Frequency    Min 6X/week      PT Plan Current plan remains appropriate    Co-evaluation              AM-PAC PT "6 Clicks" Daily Activity  Outcome Measure  Difficulty turning over in bed (including adjusting bedclothes, sheets and blankets)?: A Little Difficulty moving from lying on back to sitting on the side of the bed? : Total  Difficulty sitting down on and standing up from a chair with arms (e.g., wheelchair, bedside commode, etc,.)?: A Lot Help needed moving to and from a bed to chair (including a wheelchair)?: A Little Help needed walking in hospital room?: A Little Help needed climbing 3-5 steps with a railing? : A Lot 6 Click Score: 14    End of Session Equipment Utilized During Treatment: Gait belt Activity Tolerance: Patient tolerated treatment well Patient left: in chair;with call bell/phone within reach;with family/visitor present Nurse Communication: Mobility status;Weight bearing status PT Visit Diagnosis: Difficulty in walking, not elsewhere classified (R26.2) Pain - Right/Left: Right Pain - part of body: Leg     Time: [id number] PT Time Calculation (min) (ACUTE ONLY): 42 min  Charges:  $Gait Training: 8-22 mins $Therapeutic Activity:  23-37 mins                    G Codes:       Delaney MeigsMaija Tabor Trinton Prewitt, PT [phone]    Nyonna Hargrove B Laniya Friedl 07/12/2017, 9:39 AM

## 2017-07-13 ENCOUNTER — Encounter (HOSPITAL_COMMUNITY): Payer: Self-pay | Admitting: Orthopedic Surgery

## 2017-07-20 ENCOUNTER — Ambulatory Visit (INDEPENDENT_AMBULATORY_CARE_PROVIDER_SITE_OTHER): Payer: BC Managed Care – PPO

## 2017-07-20 ENCOUNTER — Encounter (INDEPENDENT_AMBULATORY_CARE_PROVIDER_SITE_OTHER): Payer: Self-pay | Admitting: Orthopedic Surgery

## 2017-07-20 ENCOUNTER — Ambulatory Visit (INDEPENDENT_AMBULATORY_CARE_PROVIDER_SITE_OTHER): Payer: BC Managed Care – PPO | Admitting: Orthopedic Surgery

## 2017-07-20 DIAGNOSIS — S82251D Displaced comminuted fracture of shaft of right tibia, subsequent encounter for closed fracture with routine healing: Secondary | ICD-10-CM | POA: Diagnosis not present

## 2017-07-20 MED ORDER — TEMAZEPAM 7.5 MG PO CAPS: 7.5000 mg | ORAL_CAPSULE | Freq: Every evening | ORAL | 0 refills | Status: DC | PRN

## 2017-07-20 NOTE — Progress Notes (Signed)
   Post-Op Visit Note   Patient: Hailey Carter           Date of Birth: 07/28/1976           MRN: [id number] Visit Date: 07/20/2017 PCP: System, Provider Not In   Assessment & Plan:  Chief Complaint:  Chief Complaint  Patient presents with  . Right Leg - Routine Post Op   Visit Diagnoses:  1. Displaced comminuted fracture of shaft of right tibia, subsequent encounter for closed fracture with routine healing     Plan: Hailey Carter is a patient who is now 10 days out IM tibial nail for tibial shaft fracture as well as lateral malleolus fracture fixation.  She's been doing well.  She is on Xarelto for DVT prophylaxis.  Incisions are intact.  Sutures removed.  Steri-Strips applied.  Range of motion of the knee is to 90 of flexion with straight leg raise ability noted.  No calf tenderness is present.  Plan is for ankle range of motion to continue knee range of motion and straight leg raising to continue physical therapy initiation in 2 weeks with partial weightbearing in fracture boot.  Return to see me in 3 weeks for repeat radiographs.  Follow-Up Instructions: No Follow-up on file.   Orders:  Orders Placed This Encounter  Procedures  . XR Ankle Complete Right  . XR Tibia/Fibula Right   Meds ordered this encounter  Medications  . temazepam (RESTORIL) 7.5 MG capsule    Sig: Take 1 capsule (7.5 mg total) by mouth at bedtime as needed for sleep.    Dispense:  30 capsule    Refill:  0    Imaging: Xr Ankle Complete Right  Result Date: 07/20/2017 AP lateral right ankle reviewed.  Mortise is symmetric.  Bridging plate fixation over the distal lateral malleolus fracture in good position and alignment.  Oblique fracture distal tibia also well aligned with intramedullary rod.  No pelvic skating teacher seen.  Xr Tibia/fibula Right  Result Date: 07/20/2017 AP lateral right tib-fib reviewed.  Intramedullary rod in good condition and position with 2 proximal and 2 distal interlocking screws.   Comminuted proximal fibular fracture noted with mild displacement   PMFS History: Patient Active Problem List   Diagnosis Date Noted  . Displaced comminuted fracture of shaft of right tibia, subsequent encounter for closed fracture with routine healing 07/11/2017  . Tibial fracture 07/10/2017  . Tibia/fibula fracture, right, closed, initial encounter 07/09/2017   Past Medical History:  Diagnosis Date  . Complication of anesthesia   . PONV (postoperative nausea and vomiting)     No family history on file.  Past Surgical History:  Procedure Laterality Date  . CHOLECYSTECTOMY    . COLONOSCOPY    . ECTOPIC PREGNANCY SURGERY    . IM NAILING TIBIA Right 07/10/2017  . ORIF ANKLE FRACTURE Right 07/10/2017  . TIBIA IM NAIL INSERTION Right 07/10/2017   Procedure: INTRAMEDULLARY (IM) NAIL TIBIAL ORIF ANKLE FRACTURE;  Surgeon: Cammy Copaean, Scott Clytee Heinrich, MD;  [facility];  Service: Orthopedics;  Laterality: Right;  . WISDOM TOOTH EXTRACTION     Social History   Occupational History  . Not on file.   Social History Main Topics  . Smoking status: Never Smoker  . Smokeless tobacco: Never Used  . Alcohol use No  . Drug use: No  . Sexual activity: Not on file

## 2017-07-21 ENCOUNTER — Other Ambulatory Visit (INDEPENDENT_AMBULATORY_CARE_PROVIDER_SITE_OTHER): Payer: Self-pay | Admitting: Orthopedic Surgery

## 2017-07-21 NOTE — Telephone Encounter (Signed)
y

## 2017-07-21 NOTE — Telephone Encounter (Signed)
Ok to rf? 

## 2017-07-30 ENCOUNTER — Other Ambulatory Visit (INDEPENDENT_AMBULATORY_CARE_PROVIDER_SITE_OTHER): Payer: Self-pay | Admitting: Orthopedic Surgery

## 2017-07-30 NOTE — Telephone Encounter (Signed)
If patient is up and moving around she should not need any more Xarelto blood thinner.

## 2017-08-04 NOTE — Telephone Encounter (Signed)
I called and left voicemail for patient advised ok to just take baby aspirin daily for the next month.

## 2017-08-12 ENCOUNTER — Encounter (INDEPENDENT_AMBULATORY_CARE_PROVIDER_SITE_OTHER): Payer: Self-pay | Admitting: Orthopedic Surgery

## 2017-08-12 ENCOUNTER — Ambulatory Visit (INDEPENDENT_AMBULATORY_CARE_PROVIDER_SITE_OTHER): Payer: Self-pay

## 2017-08-12 ENCOUNTER — Ambulatory Visit (INDEPENDENT_AMBULATORY_CARE_PROVIDER_SITE_OTHER): Payer: BC Managed Care – PPO

## 2017-08-12 ENCOUNTER — Ambulatory Visit (INDEPENDENT_AMBULATORY_CARE_PROVIDER_SITE_OTHER): Payer: BC Managed Care – PPO | Admitting: Orthopedic Surgery

## 2017-08-12 DIAGNOSIS — S82201A Unspecified fracture of shaft of right tibia, initial encounter for closed fracture: Secondary | ICD-10-CM

## 2017-08-12 DIAGNOSIS — S82401A Unspecified fracture of shaft of right fibula, initial encounter for closed fracture: Secondary | ICD-10-CM

## 2017-08-12 DIAGNOSIS — S82251D Displaced comminuted fracture of shaft of right tibia, subsequent encounter for closed fracture with routine healing: Secondary | ICD-10-CM

## 2017-08-12 NOTE — Progress Notes (Signed)
Post-Op Visit Note   Patient: Hailey Carter           Date of Birth: 1976/10/02           MRN: [id number] Visit Date: 08/12/2017 PCP: System, Provider Not In   Assessment & Plan:  Chief Complaint:  Chief Complaint  Patient presents with  . Right Leg - Routine Post Op  . Right Ankle - Routine Post Op   Visit Diagnoses:  1. Tibia/fibula fracture, right, closed, initial encounter   2. Displaced comminuted fracture of shaft of right tibia, subsequent encounter for closed fracture with routine healing     Plan: Verlina is a 41 year old patient with right intramedullary nail in right ankle open reduction internal fixation.  She's been doing reasonably well.  She does have a history of having low vitamin D but cannot tolerate the higher dose of vitamin D.  She has been taking over-the-counter vitamin D.  In the past.  Currently she is not taking vitamin D.  She has been weightbearing as tolerated in the fracture boot.  Radiographs don't show much in the way of callus formation around the distal tibia fracture.  Plan is for her to start taking vitamin D after she checks with her OB/GYN.  She did tell me at the end of her visit that she is pregnant.  I am going to have her continue weightbearing as tolerated with return in 4 weeks for repeat clinical check and x-rays.  Follow-Up Instructions: No Follow-up on file.   Orders:  Orders Placed This Encounter  Procedures  . XR Tibia/Fibula Right  . XR Ankle Complete Right   No orders of the defined types were placed in this encounter.   Imaging: Xr Ankle Complete Right  Result Date: 08/12/2017 AP lateral mortise right ankle reviewed.  Fixation of ankle fracture appears to be in good alignment.  Minimal callus formation noted about distal spiral transverse fracture of the tibia transfixed with intramedullary rod  Xr Tibia/fibula Right  Result Date: 08/12/2017 AP lateral right tib-fib reviewed.  Some callus formation is noted at the  proximal fibular fracture.  Not much in the way of callus formation is noted around the spiral distal tibia fracture.  No lucencies around the proximal or distal interlocking screw is seen.   PMFS History: Patient Active Problem List   Diagnosis Date Noted  . Displaced comminuted fracture of shaft of right tibia, subsequent encounter for closed fracture with routine healing 07/11/2017  . Tibial fracture 07/10/2017  . Tibia/fibula fracture, right, closed, initial encounter 07/09/2017   Past Medical History:  Diagnosis Date  . Complication of anesthesia   . PONV (postoperative nausea and vomiting)     No family history on file.  Past Surgical History:  Procedure Laterality Date  . CHOLECYSTECTOMY    . COLONOSCOPY    . ECTOPIC PREGNANCY SURGERY    . IM NAILING TIBIA Right 07/10/2017  . ORIF ANKLE FRACTURE Right 07/10/2017  . TIBIA IM NAIL INSERTION Right 07/10/2017   Procedure: INTRAMEDULLARY (IM) NAIL TIBIAL ORIF ANKLE FRACTURE;  Surgeon: Cammy Copa, MD;  Location: [facility] OR;  Service: Orthopedics;  Laterality: Right;  . WISDOM TOOTH EXTRACTION     Social History   Occupational History  . Not on file.   Social History Main Topics  . Smoking status: Never Smoker  . Smokeless tobacco: Never Used  . Alcohol use No  . Drug use: No  . Sexual activity: Not on file

## 2017-09-09 ENCOUNTER — Encounter (INDEPENDENT_AMBULATORY_CARE_PROVIDER_SITE_OTHER): Payer: Self-pay | Admitting: Orthopedic Surgery

## 2017-09-09 ENCOUNTER — Ambulatory Visit (INDEPENDENT_AMBULATORY_CARE_PROVIDER_SITE_OTHER): Payer: BC Managed Care – PPO | Admitting: Orthopedic Surgery

## 2017-09-09 DIAGNOSIS — S82401A Unspecified fracture of shaft of right fibula, initial encounter for closed fracture: Secondary | ICD-10-CM

## 2017-09-09 DIAGNOSIS — S82201A Unspecified fracture of shaft of right tibia, initial encounter for closed fracture: Secondary | ICD-10-CM

## 2017-09-09 NOTE — Progress Notes (Signed)
   Post-Op Visit Note   Patient: AMMARIE MATSUURA           Date of Birth: [birth year]           MRN: [id number] Visit Date: 09/09/2017 PCP: [facility]   Assessment & Plan:  Chief Complaint:  Chief Complaint  Patient presents with  . Post-op Follow-up    07/10/17 RT ankle ORIF/RT Tib IM nail   Visit Diagnoses:  1. Tibia/fibula fracture, right, closed, initial encounter     Plan: Poet is a 41 year old patient whose 8 weeks out right ankle and tibia fracture fixation.  She is doing well.  She has been full weightbearing in the fracture boot.  Still is having some pain after 10-15 minutes of ambulation around the tibial fracture site.  She is on vitamin D and she is pregnant.  Plan at this time is to discontinue use of the fracture boot and let her weight-bear as tolerated.  She will need repeat x-rays next visit when she is out of her first trimester.  Appropriate firing can be performed.  We really need to know if there is fracture callus formation present.  Not much was present at the first postop visit or at the 4 week visit.  Follow-Up Instructions: Return in about 4 weeks (around 10/07/2017).   Orders:  No orders of the defined types were placed in this encounter.  No orders of the defined types were placed in this encounter.   Imaging: No results found.  PMFS History: Patient Active Problem List   Diagnosis Date Noted  . Displaced comminuted fracture of shaft of right tibia, subsequent encounter for closed fracture with routine healing 07/11/2017  . Tibial fracture 07/10/2017  . Tibia/fibula fracture, right, closed, initial encounter 07/09/2017   Past Medical History:  Diagnosis Date  . Complication of anesthesia   . PONV (postoperative nausea and vomiting)     No family history on file.  Past Surgical History:  Procedure Laterality Date  . CHOLECYSTECTOMY    . COLONOSCOPY    . ECTOPIC PREGNANCY SURGERY    . IM NAILING TIBIA Right  07/10/2017  . ORIF ANKLE FRACTURE Right 07/10/2017  . TIBIA IM NAIL INSERTION Right 07/10/2017   Procedure: INTRAMEDULLARY (IM) NAIL TIBIAL ORIF ANKLE FRACTURE;  Surgeon: Cammy Copa, MD;  Location[facility];  Service: Orthopedics;  Laterality: Right;  . WISDOM TOOTH EXTRACTION     Social History   Occupational History  . Not on file.   Social History Main Topics  . Smoking status: Never Smoker  . Smokeless tobacco: Never Used  . Alcohol use No  . Drug use: No  . Sexual activity: Not on file

## 2017-09-16 ENCOUNTER — Encounter (HOSPITAL_COMMUNITY): Payer: Self-pay

## 2017-09-16 ENCOUNTER — Other Ambulatory Visit: Payer: Self-pay | Admitting: Obstetrics and Gynecology

## 2017-09-17 ENCOUNTER — Encounter (HOSPITAL_COMMUNITY)
Admission: AD | Disposition: A | Discharge status: Home / Self Care | Payer: Self-pay | Source: Ambulatory Visit | Attending: Obstetrics and Gynecology

## 2017-09-17 ENCOUNTER — Ambulatory Visit (HOSPITAL_COMMUNITY): Payer: BC Managed Care – PPO | Admitting: Anesthesiology

## 2017-09-17 ENCOUNTER — Ambulatory Visit (HOSPITAL_COMMUNITY)
Admission: AD | Admit: 2017-09-17 | Discharge: 2017-09-17 | Disposition: A | Discharge status: Home / Self Care | Payer: BC Managed Care – PPO | Source: Ambulatory Visit | Attending: Obstetrics and Gynecology | Admitting: Obstetrics and Gynecology

## 2017-09-17 ENCOUNTER — Encounter (HOSPITAL_COMMUNITY): Payer: Self-pay | Admitting: Emergency Medicine

## 2017-09-17 DIAGNOSIS — F419 Anxiety disorder, unspecified: Secondary | ICD-10-CM | POA: Diagnosis not present

## 2017-09-17 DIAGNOSIS — Z79899 Other long term (current) drug therapy: Secondary | ICD-10-CM | POA: Insufficient documentation

## 2017-09-17 DIAGNOSIS — F329 Major depressive disorder, single episode, unspecified: Secondary | ICD-10-CM | POA: Insufficient documentation

## 2017-09-17 DIAGNOSIS — Z7982 Long term (current) use of aspirin: Secondary | ICD-10-CM | POA: Diagnosis not present

## 2017-09-17 DIAGNOSIS — O021 Missed abortion: Secondary | ICD-10-CM | POA: Insufficient documentation

## 2017-09-17 DIAGNOSIS — N96 Recurrent pregnancy loss: Secondary | ICD-10-CM | POA: Diagnosis not present

## 2017-09-17 HISTORY — DX: Major depressive disorder, single episode, unspecified: F32.9

## 2017-09-17 HISTORY — DX: Headache: R51

## 2017-09-17 HISTORY — DX: Anxiety disorder, unspecified: F41.9

## 2017-09-17 HISTORY — DX: Headache, unspecified: R51.9

## 2017-09-17 HISTORY — PX: DILATION AND EVACUATION: SHX1459

## 2017-09-17 HISTORY — DX: Depression, unspecified: F32.A

## 2017-09-17 HISTORY — DX: Other specified soft tissue disorders: M79.89

## 2017-09-17 LAB — CBC
HCT: 40.7 % (ref 36.0–46.0)
HEMOGLOBIN: 13.7 g/dL (ref 12.0–15.0)
MCH: 31.2 pg (ref 26.0–34.0)
MCHC: 33.7 g/dL (ref 30.0–36.0)
MCV: 92.7 fL (ref 78.0–100.0)
PLATELETS: 202 10*3/uL (ref 150–400)
RBC: 4.39 MIL/uL (ref 3.87–5.11)
RDW: 13.1 % (ref 11.5–15.5)
WBC: 6.3 10*3/uL (ref 4.0–10.5)

## 2017-09-17 SURGERY — DILATION AND EVACUATION, UTERUS
Anesthesia: General | Site: Vagina

## 2017-09-17 MED ORDER — LIDOCAINE HCL (CARDIAC) 20 MG/ML IV SOLN
INTRAVENOUS | Status: AC
  Filled 2017-09-17: qty 5

## 2017-09-17 MED ORDER — FENTANYL CITRATE (PF) 100 MCG/2ML IJ SOLN
INTRAMUSCULAR | Status: AC
  Filled 2017-09-17: qty 2

## 2017-09-17 MED ORDER — FENTANYL CITRATE (PF) 100 MCG/2ML IJ SOLN
INTRAMUSCULAR | Status: DC | PRN
  Administered 2017-09-17 (×2): 50 ug via INTRAVENOUS

## 2017-09-17 MED ORDER — SCOPOLAMINE 1 MG/3DAYS TD PT72
MEDICATED_PATCH | TRANSDERMAL | Status: AC
  Administered 2017-09-17: 1.5 mg via TRANSDERMAL
  Filled 2017-09-17: qty 1

## 2017-09-17 MED ORDER — MIDAZOLAM HCL 2 MG/2ML IJ SOLN
INTRAMUSCULAR | Status: AC
  Filled 2017-09-17: qty 2

## 2017-09-17 MED ORDER — KETOROLAC TROMETHAMINE 30 MG/ML IJ SOLN
INTRAMUSCULAR | Status: AC
  Filled 2017-09-17: qty 1

## 2017-09-17 MED ORDER — HYDROMORPHONE HCL 1 MG/ML IJ SOLN: 0.2500 mg | INTRAMUSCULAR | Status: DC | PRN

## 2017-09-17 MED ORDER — DEXAMETHASONE SODIUM PHOSPHATE 4 MG/ML IJ SOLN
INTRAMUSCULAR | Status: AC
  Filled 2017-09-17: qty 1

## 2017-09-17 MED ORDER — TRAMADOL HCL 50 MG PO TABS: 50.0000 mg | ORAL_TABLET | Freq: Four times a day (QID) | ORAL | 0 refills | Status: DC | PRN

## 2017-09-17 MED ORDER — PROPOFOL 10 MG/ML IV BOLUS
INTRAVENOUS | Status: DC | PRN
  Administered 2017-09-17: 100 mg via INTRAVENOUS
  Administered 2017-09-17: 10 mg via INTRAVENOUS
  Administered 2017-09-17 (×2): 20 mg via INTRAVENOUS
  Administered 2017-09-17: 30 mg via INTRAVENOUS

## 2017-09-17 MED ORDER — SCOPOLAMINE 1 MG/3DAYS TD PT72
1.0000 | MEDICATED_PATCH | Freq: Once | TRANSDERMAL | Status: DC
  Administered 2017-09-17: 1.5 mg via TRANSDERMAL

## 2017-09-17 MED ORDER — CEFAZOLIN SODIUM-DEXTROSE 2-4 GM/100ML-% IV SOLN
2.0000 g | INTRAVENOUS | Status: AC
  Administered 2017-09-17: 2 g via INTRAVENOUS

## 2017-09-17 MED ORDER — BUPIVACAINE HCL (PF) 0.25 % IJ SOLN
INTRAMUSCULAR | Status: DC | PRN
  Administered 2017-09-17: 20 mL

## 2017-09-17 MED ORDER — PROPOFOL 10 MG/ML IV BOLUS
INTRAVENOUS | Status: AC
  Filled 2017-09-17: qty 20

## 2017-09-17 MED ORDER — ONDANSETRON HCL 4 MG/2ML IJ SOLN
INTRAMUSCULAR | Status: AC
  Filled 2017-09-17: qty 2

## 2017-09-17 MED ORDER — CEFAZOLIN SODIUM-DEXTROSE 2-4 GM/100ML-% IV SOLN
INTRAVENOUS | Status: AC
  Filled 2017-09-17: qty 100

## 2017-09-17 MED ORDER — ONDANSETRON HCL 4 MG/2ML IJ SOLN
INTRAMUSCULAR | Status: DC | PRN
  Administered 2017-09-17: 4 mg via INTRAVENOUS

## 2017-09-17 MED ORDER — RHO D IMMUNE GLOBULIN 1500 UNIT/2ML IJ SOSY
300.0000 ug | PREFILLED_SYRINGE | Freq: Once | INTRAMUSCULAR | Status: AC
  Administered 2017-09-17: 300 ug via INTRAVENOUS
  Filled 2017-09-17: qty 2

## 2017-09-17 MED ORDER — PROMETHAZINE HCL 25 MG/ML IJ SOLN: 6.2500 mg | INTRAMUSCULAR | Status: DC | PRN

## 2017-09-17 MED ORDER — KETOROLAC TROMETHAMINE 30 MG/ML IJ SOLN
INTRAMUSCULAR | Status: DC | PRN
  Administered 2017-09-17: 30 mg via INTRAVENOUS

## 2017-09-17 MED ORDER — LACTATED RINGERS IV SOLN
INTRAVENOUS | Status: DC
  Administered 2017-09-17: 10:00:00 via INTRAVENOUS

## 2017-09-17 MED ORDER — OXYCODONE HCL 5 MG PO TABS
5.0000 mg | ORAL_TABLET | Freq: Once | ORAL | Status: DC | PRN
Start: 2017-09-17 — End: 2017-09-17

## 2017-09-17 MED ORDER — DEXAMETHASONE SODIUM PHOSPHATE 10 MG/ML IJ SOLN
INTRAMUSCULAR | Status: DC | PRN
  Administered 2017-09-17: 4 mg via INTRAVENOUS

## 2017-09-17 MED ORDER — BUPIVACAINE HCL (PF) 0.25 % IJ SOLN
INTRAMUSCULAR | Status: AC
  Filled 2017-09-17: qty 30

## 2017-09-17 MED ORDER — OXYCODONE HCL 5 MG/5ML PO SOLN: 5.0000 mg | Freq: Once | ORAL | Status: DC | PRN

## 2017-09-17 MED ORDER — MIDAZOLAM HCL 2 MG/2ML IJ SOLN
INTRAMUSCULAR | Status: DC | PRN
  Administered 2017-09-17: 2 mg via INTRAVENOUS

## 2017-09-17 MED ORDER — LIDOCAINE HCL (CARDIAC) 20 MG/ML IV SOLN
INTRAVENOUS | Status: DC | PRN
  Administered 2017-09-17: 30 mg via INTRAVENOUS

## 2017-09-17 SURGICAL SUPPLY — 17 items
CATH ROBINSON RED A/P 16FR (CATHETERS) ×2 IMPLANT
DECANTER SPIKE VIAL GLASS SM (MISCELLANEOUS) ×2 IMPLANT
GLOVE BIO SURGEON STRL SZ7.5 (GLOVE) ×2 IMPLANT
GLOVE BIOGEL PI IND STRL 7.0 (GLOVE) ×1 IMPLANT
GLOVE BIOGEL PI INDICATOR 7.0 (GLOVE) ×1
GOWN STRL REUS W/TWL LRG LVL3 (GOWN DISPOSABLE) ×4 IMPLANT
KIT BERKELEY 1ST TRIMESTER 3/8 (MISCELLANEOUS) ×2 IMPLANT
NS IRRIG 1000ML POUR BTL (IV SOLUTION) ×2 IMPLANT
PACK VAGINAL MINOR WOMEN LF (CUSTOM PROCEDURE TRAY) ×2 IMPLANT
PAD OB MATERNITY 4.3X12.25 (PERSONAL CARE ITEMS) ×2 IMPLANT
PAD PREP 24X48 CUFFED NSTRL (MISCELLANEOUS) ×2 IMPLANT
SET BERKELEY SUCTION TUBING (SUCTIONS) ×2 IMPLANT
TOWEL OR 17X24 6PK STRL BLUE (TOWEL DISPOSABLE) ×4 IMPLANT
VACURETTE 10 RIGID CVD (CANNULA) IMPLANT
VACURETTE 7MM CVD STRL WRAP (CANNULA) IMPLANT
VACURETTE 8 RIGID CVD (CANNULA) IMPLANT
VACURETTE 9 RIGID CVD (CANNULA) IMPLANT

## 2017-09-17 NOTE — Discharge Instructions (Signed)

## 2017-09-17 NOTE — Anesthesia Postprocedure Evaluation (Signed)
Anesthesia Post Note  Patient: Hailey Carter  Procedure(s) Performed: Procedure(s) (LRB): DILATATION AND EVACUATION (N/A)     Patient location during evaluation: PACU Anesthesia Type: MAC Level of consciousness: awake and alert Pain management: pain level controlled Vital Signs Assessment: post-procedure vital signs reviewed and stable Respiratory status: spontaneous breathing, nonlabored ventilation, respiratory function stable and patient connected to nasal cannula oxygen Cardiovascular status: stable and blood pressure returned to baseline Postop Assessment: no apparent nausea or vomiting Anesthetic complications: no    Last Vitals:  Vitals:   09/17/17 1245 09/17/17 1320  BP: 110/79 108/79  Pulse: 70 80  Resp: 15 16  Temp:  36.8 C  SpO2: 100% 98%    Last Pain:  Vitals:   09/17/17 0940  TempSrc: Oral   Pain Goal: Patients Stated Pain Goal: 3 (09/17/17 0940)               Thurmond Butts P Ellender

## 2017-09-17 NOTE — Op Note (Signed)
NAME:  Hailey Carter, Hailey Carter NO.:  [id number]  MEDICAL RECORD NO.:  [id number]  LOCATION:                                 FACILITY:  PHYSICIAN:  Lenoard Aden, M.D.     DATE OF BIRTH:  DATE OF PROCEDURE: DATE OF DISCHARGE:                              OPERATIVE REPORT   PREOPERATIVE DIAGNOSES: 1. Missed abortion. 2. Recurrent pregnancy loss.  POSTOPERATIVE DIAGNOSES: 1. Missed abortion. 2. Recurrent pregnancy loss.  PROCEDURE:  Suction D and E with tissue sent for chromosome.  SURGEON:  Lenoard Aden, M.D.  ASSISTANT:  None.  ANESTHESIA:  IV sedation and local.  ESTIMATED BLOOD LOSS:  Less than 50 mL.  SPECIMENS:  Products of conception to Pathology.  BRIEF OPERATIVE NOTE:  After being apprised of risks of anesthesia, infection, bleeding, injury to surrounding organs, possible need for repair, delayed versus immediate complications to include bowel and bladder injury, possible need for repair, the patient was brought to the operating room after consent was signed.  She was placed in a dorsal lithotomy position.  Feet were placed in Yellofin stirrups.  She was administered IV sedation without difficulty, prepped and draped in usual sterile fashion.  Catheterized until the bladder was empty.  Exam under anesthesia revealed a mid positioned uterus, enlarged to approximately 8 to 10 weeks.  No adnexal masses are appreciated.  Dilute paracervical block placed using 0.25% Nesacaine, 20 mL total, in standard fashion. No complications noted.  Cervix easily dilated up to a 21 Pratt dilator and the 7 mm suction curette was placed for aspiration of products of conception which is visualized, is noted. Repeat suction and curettage using blunt curettage in a 4-quadrant method reveals the cavity to be empty.  Good hemostasis was noted.  All instruments were removed.  The patient tolerated the procedure well, was transferred to the recovery room in good  condition.     Lenoard Aden, M.D.     RJT/MEDQ  D:  09/17/2017  T:  09/17/2017  Job:  [id number]  cc:   Lenoard Aden, M.D. Fax: [phone]

## 2017-09-17 NOTE — Op Note (Signed)
09/17/2017  11:24 AM  PATIENT:  Hailey Carter  41 y.o. female  PRE-OPERATIVE DIAGNOSIS:  Missed Abortion Recurrent pregnancy loss  POST-OPERATIVE DIAGNOSIS:  Missed Abortion Same  PROCEDURE:  Procedure(s): DILATATION AND EVACUATION  SURGEON:  Surgeon(s): Olivia Mackie, MD  ASSISTANTS: none   ANESTHESIA:   local and IV sedation  ESTIMATED BLOOD LOSS: minimal  DRAINS: none   LOCAL MEDICATIONS USED:  MARCAINE    and Amount: 20 ml  SPECIMEN:  Source of Specimen:  poc  DISPOSITION OF SPECIMEN:  PATHOLOGY  COUNTS:  YES  DICTATION #: 1131/88  PLAN OF CARE: dc home  PATIENT DISPOSITION:  PACU - hemodynamically stable.

## 2017-09-17 NOTE — Anesthesia Preprocedure Evaluation (Addendum)
Anesthesia Evaluation  Patient identified by MRN, date of birth, ID band Patient awake    Reviewed: Allergy & Precautions, NPO status , Patient's Chart, lab work & pertinent test results  History of Anesthesia Complications (+) PONV and history of anesthetic complications  Airway Mallampati: II  TM Distance: >3 FB Neck ROM: Full    Dental no notable dental hx.    Pulmonary neg pulmonary ROS,    Pulmonary exam normal breath sounds clear to auscultation       Cardiovascular negative cardio ROS Normal cardiovascular exam Rhythm:Regular Rate:Normal     Neuro/Psych PSYCHIATRIC DISORDERS Anxiety Depression negative neurological ROS     GI/Hepatic negative GI ROS, Neg liver ROS,   Endo/Other  negative endocrine ROS  Renal/GU negative Renal ROS     Musculoskeletal negative musculoskeletal ROS (+)   Abdominal (+) + obese,   Peds  Hematology negative hematology ROS (+)   Anesthesia Other Findings   Reproductive/Obstetrics Missed Abortion                            Anesthesia Physical Anesthesia Plan  ASA: II  Anesthesia Plan: MAC   Post-op Pain Management:    Induction: Intravenous  PONV Risk Score and Plan: 3 and Ondansetron, Dexamethasone, Midazolam and Propofol infusion  Airway Management Planned: Natural Airway  Additional Equipment:   Intra-op Plan:   Post-operative Plan:   Informed Consent: I have reviewed the patients History and Physical, chart, labs and discussed the procedure including the risks, benefits and alternatives for the proposed anesthesia with the patient or authorized representative who has indicated his/her understanding and acceptance.   Dental advisory given  Plan Discussed with: CRNA  Anesthesia Plan Comments:       Anesthesia Quick Evaluation

## 2017-09-17 NOTE — H&P (Signed)
Hailey Carter is an 41 y.o. female. MAB. Recurrent loss.  Pertinent Gynecological History: Menses: flow is moderate Bleeding: dysfunctional uterine bleeding Contraception: none DES exposure: denies Blood transfusions: none Sexually transmitted diseases: no past history Previous GYN Procedures: DNC  Last mammogram: normal Date: 2018 Last pap: normal Date: 2018 OB History: G3, P0   Menstrual History: Menarche age: 46 Patient's last menstrual period was 07/12/2017 (exact date).    Past Medical History:  Diagnosis Date  . Anxiety   . Complication of anesthesia   . Depression   . Headache    Migraines  . Leg swelling    right leg recently had surgical procedure  . PONV (postoperative nausea and vomiting)     Past Surgical History:  Procedure Laterality Date  . CHOLECYSTECTOMY    . COLONOSCOPY    . ECTOPIC PREGNANCY SURGERY    . IM NAILING TIBIA Right 07/10/2017  . ORIF ANKLE FRACTURE Right 07/10/2017  . TIBIA IM NAIL INSERTION Right 07/10/2017   Procedure: INTRAMEDULLARY (IM) NAIL TIBIAL ORIF ANKLE FRACTURE;  Surgeon: Cammy Copa, MD;  Location: [facility] OR;  Service: Orthopedics;  Laterality: Right;  . WISDOM TOOTH EXTRACTION      History reviewed. No pertinent family history.  Social History:  reports that she has never smoked. She has never used smokeless tobacco. She reports that she does not drink alcohol or use drugs.  Allergies: No Known Allergies  Prescriptions Prior to Admission  Medication Sig Dispense Refill Last Dose  . acetaminophen (TYLENOL) 500 MG tablet Take 1,000 mg by mouth every 6 (six) hours as needed for mild pain, moderate pain or headache.   09/16/2017 at Unknown time  . aspirin EC 81 MG tablet Take 81 mg by mouth daily.   09/16/2017 at Unknown time  . Cholecalciferol (VITAMIN D3) 1000 units CAPS Take 200 Units by mouth daily.   09/16/2017 at Unknown time  . folic acid (FOLVITE) 1 MG tablet Take 4 mg by mouth daily.   09/16/2017 at Unknown time  .  montelukast (SINGULAIR) 10 MG tablet Take 10 mg by mouth at bedtime.   09/16/2017 at Unknown time  . Prenatal Vit-Fe Fumarate-FA (PRENATAL MULTIVITAMIN) TABS tablet Take 1 tablet by mouth daily at 12 noon.   09/16/2017 at Unknown time  . sertraline (ZOLOFT) 50 MG tablet Take 50 mg by mouth daily.   09/16/2017 at Unknown time    Review of Systems  Constitutional: Negative.   All other systems reviewed and are negative.   Blood pressure 135/87, pulse 84, temperature 98.8 F (37.1 C), temperature source Oral, resp. rate 15, height  (1.753 m), weight 99.3 kg (219 lb), last menstrual period 07/12/2017, SpO2 100 %. Physical Exam  Nursing note and vitals reviewed. Constitutional: She is oriented to person, place, and time. She appears well-developed and well-nourished.  HENT:  Head: Normocephalic and atraumatic.  Neck: Normal range of motion. Neck supple.  Cardiovascular: Normal rate and regular rhythm.   Respiratory: Effort normal and breath sounds normal.  GI: Soft. Bowel sounds are normal.  Genitourinary: Vagina normal and uterus normal.  Musculoskeletal: Normal range of motion.  Neurological: She is alert and oriented to person, place, and time. She has normal reflexes.  Skin: Skin is warm and dry.  Psychiatric: She has a normal mood and affect.    Results for orders placed or performed during the hospital encounter of 09/17/17 (from the past 24 hour(s))  CBC     Status: None   Collection  Time: 09/17/17  9:50 AM  Result Value Ref Range   WBC 6.3 4.0 - 10.5 K/uL   RBC 4.39 3.87 - 5.11 [name]/uL   Hemoglobin 13.7 12.0 - 15.0 g/dL   HCT 16.1 09.6 - 04.5 %   MCV 92.7 78.0 - 100.0 fL   MCH 31.2 26.0 - 34.0 pg   MCHC 33.7 30.0 - 36.0 g/dL   RDW 40.9 81.1 - 91.4 %   Platelets 202 150 - 400 K/uL    No results found.  Assessment/Plan: MAB RPL Suction D&E Tissue for chromosomes Consent done  Tina Gruner J 09/17/2017, 10:29 AM

## 2017-09-17 NOTE — Transfer of Care (Signed)
Immediate Anesthesia Transfer of Care Note  Patient: Hailey Carter  Procedure(s) Performed: Procedure(s): DILATATION AND EVACUATION (N/A)  Patient Location: PACU  Anesthesia Type:MAC  Level of Consciousness: awake, alert , oriented and patient cooperative  Airway & Oxygen Therapy: Patient Spontanous Breathing  Post-op Assessment: Report given to RN and Post -op Vital signs reviewed and stable  Post vital signs: Reviewed and stable  Last Vitals:  Vitals:   09/17/17 0940  BP: 135/87  Pulse: 84  Resp: 15  Temp: 37.1 C  SpO2: 100%    Last Pain:  Vitals:   09/17/17 0940  TempSrc: Oral      Patients Stated Pain Goal: 3 (64/40/34 7425)  Complications: No apparent anesthesia complications

## 2017-09-18 ENCOUNTER — Encounter (HOSPITAL_COMMUNITY): Payer: Self-pay | Admitting: Obstetrics and Gynecology

## 2017-09-18 LAB — RH IG WORKUP (INCLUDES ABO/RH)
ABO/RH(D): A NEG
Antibody Screen: NEGATIVE
Gestational Age(Wks): 8
UNIT DIVISION: 0

## 2017-10-06 LAB — CHROMOSOME STD, POC(TISSUE)-NCBH

## 2017-10-06 LAB — TISSUE HYBRIDIZATION TO NCBH

## 2017-10-09 ENCOUNTER — Ambulatory Visit (INDEPENDENT_AMBULATORY_CARE_PROVIDER_SITE_OTHER): Payer: BC Managed Care – PPO | Admitting: Orthopedic Surgery

## 2017-10-09 ENCOUNTER — Ambulatory Visit (INDEPENDENT_AMBULATORY_CARE_PROVIDER_SITE_OTHER): Payer: BC Managed Care – PPO

## 2017-10-09 ENCOUNTER — Encounter (INDEPENDENT_AMBULATORY_CARE_PROVIDER_SITE_OTHER): Payer: Self-pay | Admitting: Orthopedic Surgery

## 2017-10-09 DIAGNOSIS — S82251D Displaced comminuted fracture of shaft of right tibia, subsequent encounter for closed fracture with routine healing: Secondary | ICD-10-CM

## 2017-10-09 NOTE — Progress Notes (Signed)
   Post-Op Visit Note   Patient: [name]           Date of Birth: 03/15/1976           MRN: [id number] Visit Date: 10/09/2017 PCP: Doreene Elandhomas, Millard B, MD   Assessment & Plan:  Chief Complaint:  Chief Complaint  Patient presents with  . Right Ankle - Follow-up  . Right Leg - Follow-up   Visit Diagnoses:  1. Displaced comminuted fracture of shaft of right tibia, subsequent encounter for closed fracture with routine healing     Plan: [name] is a [age] patient who is now 3 month out from right I am nail in right ankle fixation.  She's been doing bette On examination she is walking without a limp.  Has an occasional proximal popping.  Radiographs show healing of the fractures.  Plan at this time his activity as tolerated ambulation as tolerated.  Form to return to work on Monday filled out.  Follow-up with me as needed.  Follow-Up Instructions: No Follow-up on file.   Orders:  Orders Placed This Encounter  Procedures  . XR Tibia/Fibula Right  . XR Ankle Complete Right   No orders of the defined types were placed in this encounter.   Imaging: Xr Ankle Complete Right  Result Date: 10/09/2017 AP lateral mortise right ankle reviewed. Syndesmosis is stable.  Mortise symmetric.  Fracture healing of the fibular shaft fracture and distal tibia fracture is in progress calcium formation is noted  Xr Tibia/fibula Right  Result Date: 10/09/2017 AP lateral right tib-fib reviewed.  Interval callus formation noted in the spiral distal tibial shaft fracture.  Distal fibula fracture alignment remains anatomic.  No evidence of hardware failure complicating features   PMFS History: Patient Active Problem List   Diagnosis Date Noted  . Displaced comminuted fracture of shaft of right tibia, subsequent encounter for closed fracture with routine healing 07/11/2017  . Tibial fracture 07/10/2017  . Tibia/fibula fracture, right, closed, initial encounter 07/09/2017   Past Medical History:    Diagnosis Date  . Anxiety   . Complication of anesthesia   . Depression   . Headache    Migraines  . Leg swelling    right leg recently had surgical procedure  . PONV (postoperative nausea and vomiting)     No family history on file.  Past Surgical History:  Procedure Laterality Date  . CHOLECYSTECTOMY    . COLONOSCOPY    . DILATION AND EVACUATION N/A 09/17/2017   Procedure: DILATATION AND EVACUATION;  Surgeon: Olivia Mackieaavon, Richard, MD;  Location: [facility] ORS;  Service: Gynecology;  Laterality: N/A;  . ECTOPIC PREGNANCY SURGERY    . IM NAILING TIBIA Right 07/10/2017  . ORIF ANKLE FRACTURE Right 07/10/2017  . TIBIA IM NAIL INSERTION Right 07/10/2017   Procedure: INTRAMEDULLARY (IM) NAIL TIBIAL ORIF ANKLE FRACTURE;  Surgeon: Cammy Copaean, Scott Gregory, MD;  [facility];  Service: Orthopedics;  Laterality: Right;  . WISDOM TOOTH EXTRACTION     Social History   Occupational History  . Not on file.   Social History Main Topics  . Smoking status: Never Smoker  . Smokeless tobacco: Never Used  . Alcohol use No  . Drug use: No  . Sexual activity: Not on file

## 2018-11-03 DIAGNOSIS — Z23 Encounter for immunization: Secondary | ICD-10-CM | POA: Diagnosis not present

## 2018-11-03 DIAGNOSIS — R21 Rash and other nonspecific skin eruption: Secondary | ICD-10-CM | POA: Diagnosis not present

## 2018-11-09 DIAGNOSIS — L509 Urticaria, unspecified: Secondary | ICD-10-CM | POA: Diagnosis not present

## 2018-11-17 DIAGNOSIS — Z09 Encounter for follow-up examination after completed treatment for conditions other than malignant neoplasm: Secondary | ICD-10-CM | POA: Diagnosis not present

## 2018-11-17 DIAGNOSIS — R21 Rash and other nonspecific skin eruption: Secondary | ICD-10-CM | POA: Diagnosis not present

## 2018-11-17 DIAGNOSIS — J3089 Other allergic rhinitis: Secondary | ICD-10-CM | POA: Diagnosis not present

## 2019-01-09 IMAGING — RF DG C-ARM 61-120 MIN
1 series · 6 of 6 positions shown · non-contrast
Comparison: 07/09/2017

CLINICAL DATA: Tibial and fibular fractures

EXAM:
DG C-ARM 61-120 MIN; RIGHT TIBIA AND FIBULA - 2 VIEW

[Series 1: run · 6 of 6 slices shown]
[im 1/6]
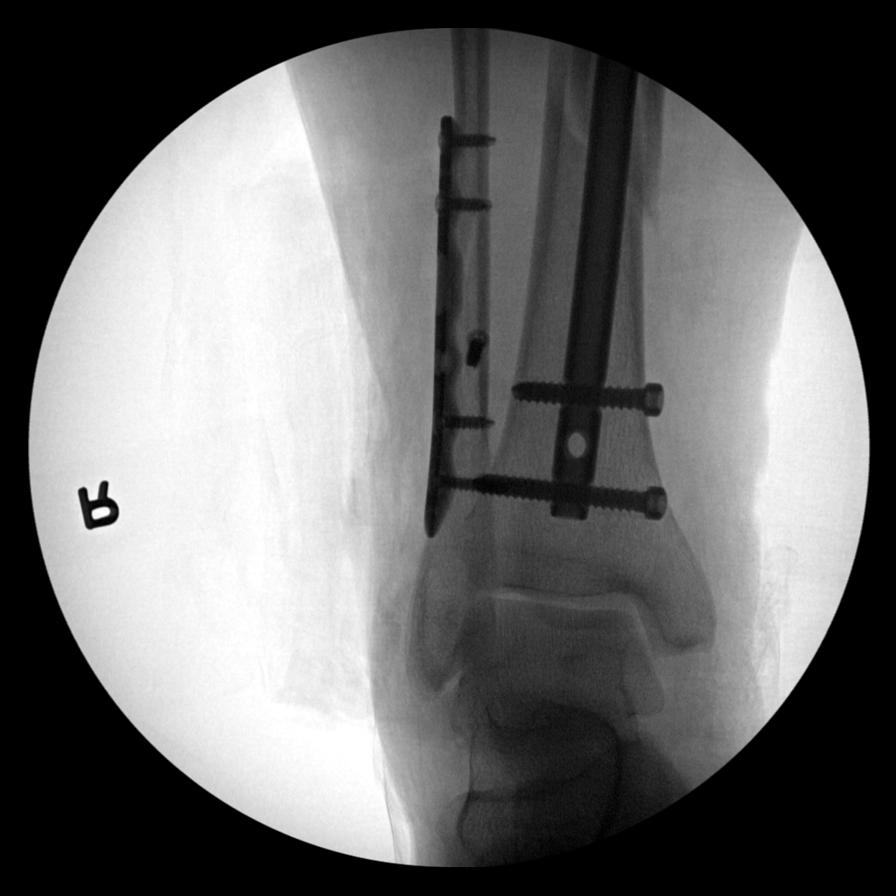
[im 2/6]
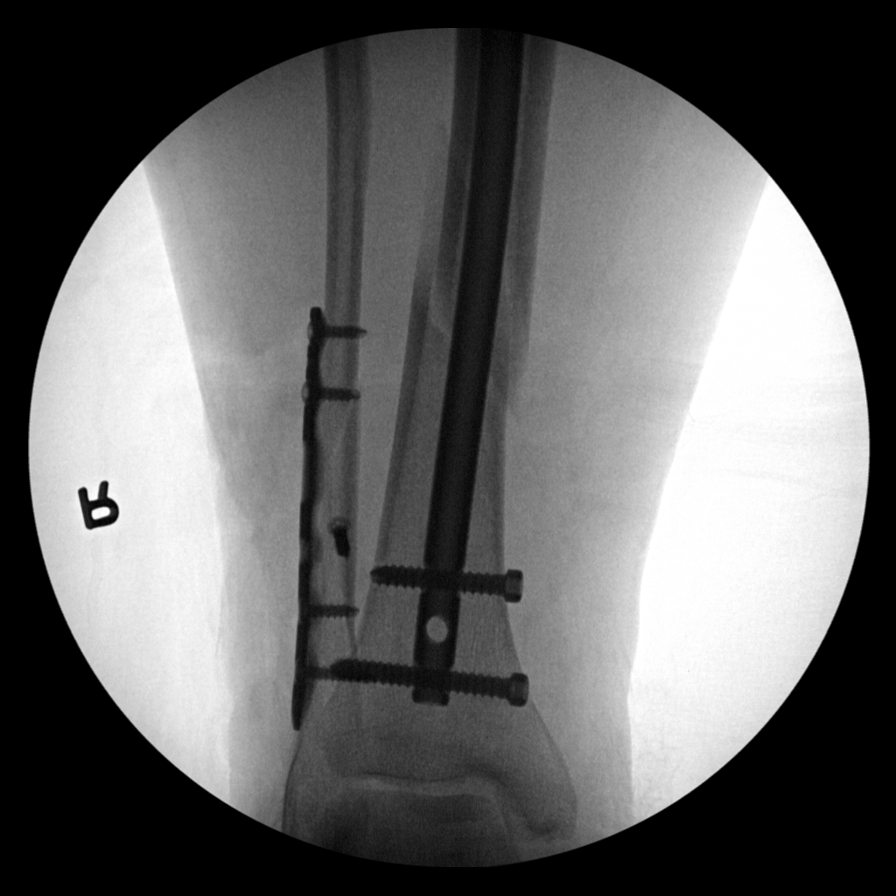
[im 3/6]
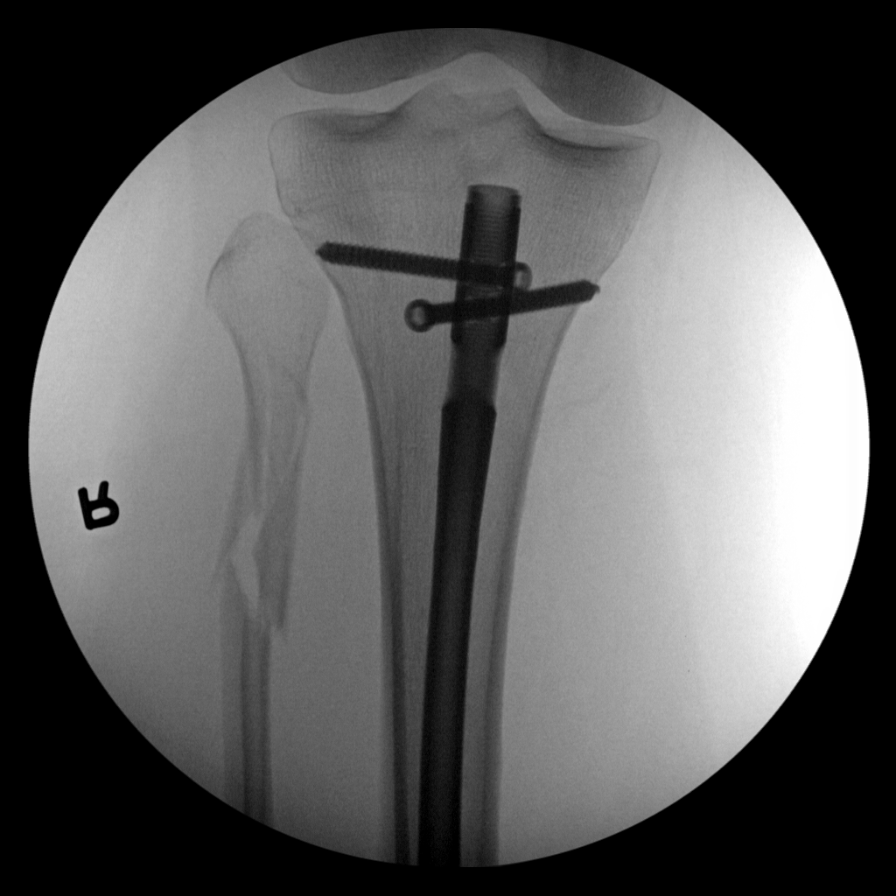
[im 4/6]
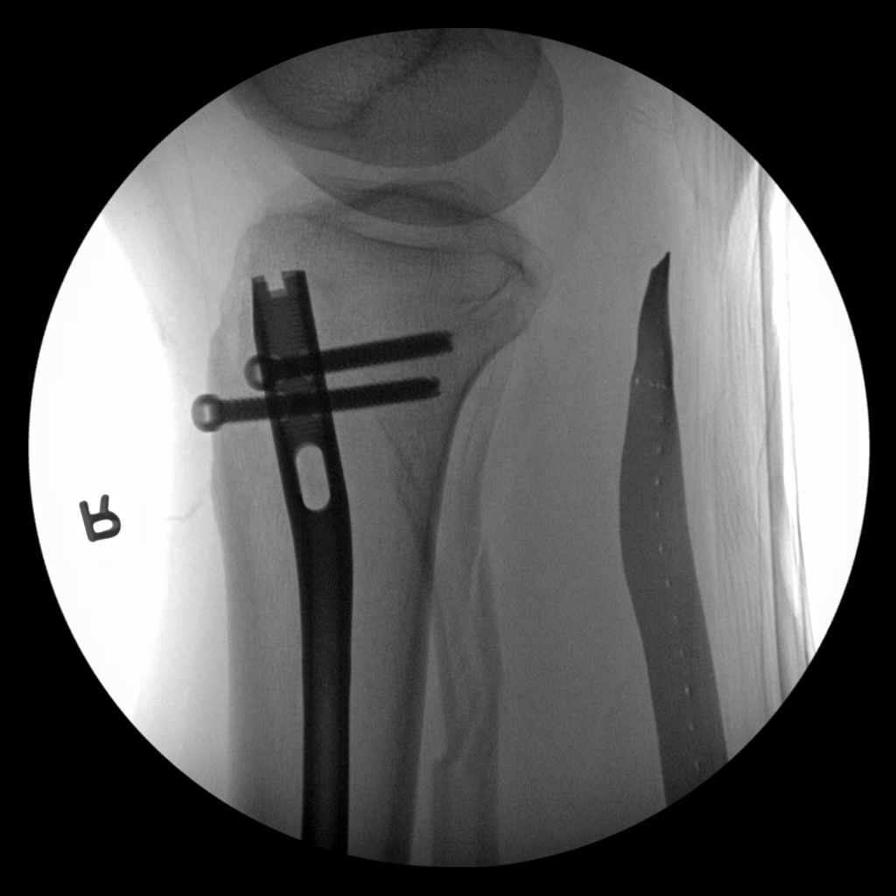
[im 5/6]
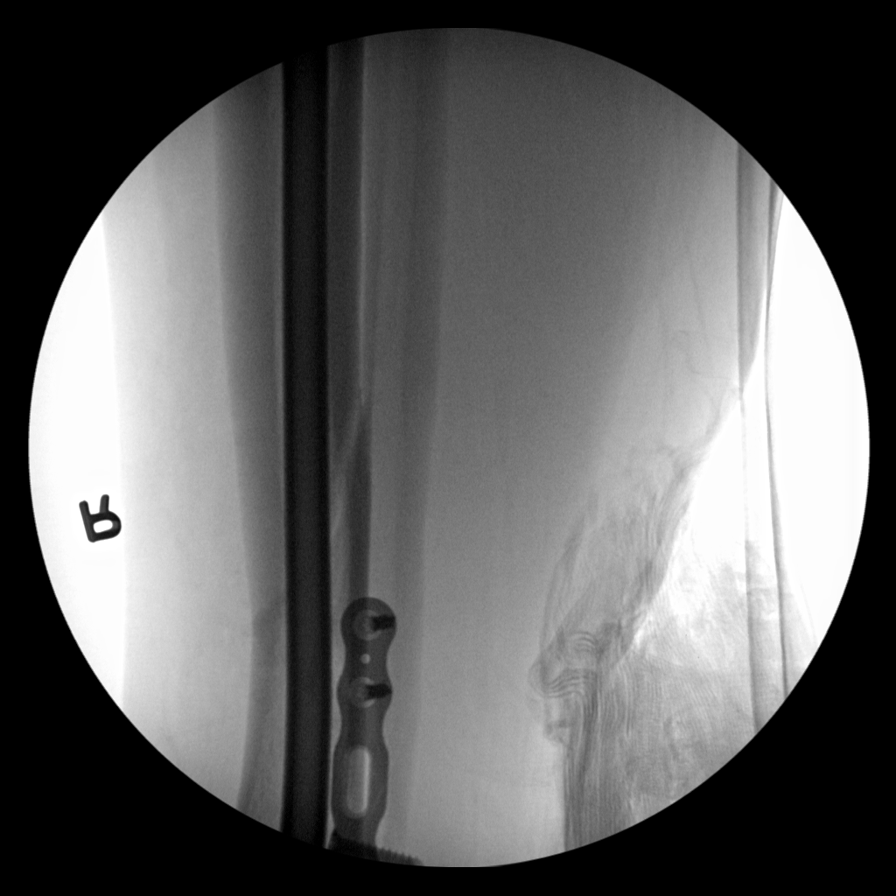
[im 6/6]
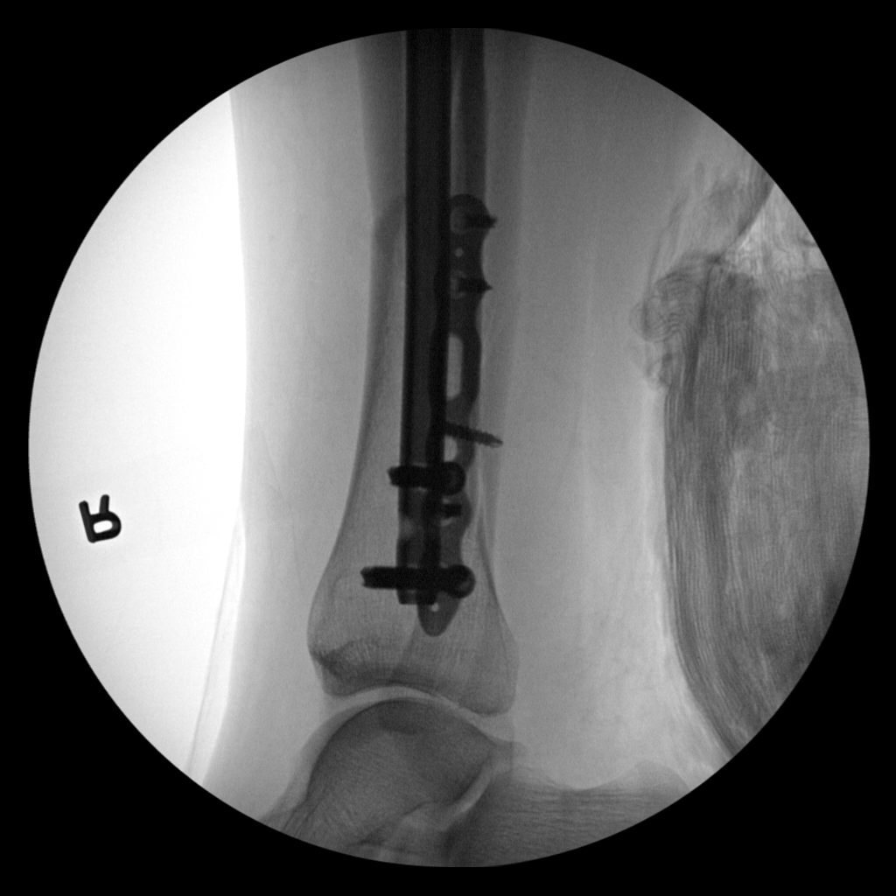

[6 of 6 positions shown; findings below may reference images not displayed]

FLUOROSCOPY TIME:  Fluoroscopy Time:  3 minutes 12 seconds

Radiation Exposure Index (if provided by the fluoroscopic device):
Not available

Number of Acquired Spot Images: 6
FINDINGS: Medullary rod is noted within the tibia with proximal and distal
fixation screws. Fixation sideplate along the distal fibula is
noted. Fracture fragments are in near anatomic alignment.
IMPRESSION: ORIF of right tibial and fibular fractures

## 2019-01-11 DIAGNOSIS — Z136 Encounter for screening for cardiovascular disorders: Secondary | ICD-10-CM | POA: Diagnosis not present

## 2019-01-11 DIAGNOSIS — Z130 Encounter for screening for diseases of the blood and blood-forming organs and certain disorders involving the immune mechanism: Secondary | ICD-10-CM | POA: Diagnosis not present

## 2019-01-11 DIAGNOSIS — E559 Vitamin D deficiency, unspecified: Secondary | ICD-10-CM | POA: Diagnosis not present

## 2019-01-19 DIAGNOSIS — Z634 Disappearance and death of family member: Secondary | ICD-10-CM | POA: Diagnosis not present

## 2019-01-21 DIAGNOSIS — Z3201 Encounter for pregnancy test, result positive: Secondary | ICD-10-CM | POA: Diagnosis not present

## 2019-01-24 DIAGNOSIS — Z0000 Encounter for general adult medical examination without abnormal findings: Secondary | ICD-10-CM | POA: Diagnosis not present

## 2019-02-01 DIAGNOSIS — Z3201 Encounter for pregnancy test, result positive: Secondary | ICD-10-CM | POA: Diagnosis not present

## 2019-02-21 DIAGNOSIS — O09521 Supervision of elderly multigravida, first trimester: Secondary | ICD-10-CM | POA: Diagnosis not present

## 2019-03-04 DIAGNOSIS — O09521 Supervision of elderly multigravida, first trimester: Secondary | ICD-10-CM | POA: Diagnosis not present

## 2019-05-06 DIAGNOSIS — O09522 Supervision of elderly multigravida, second trimester: Secondary | ICD-10-CM | POA: Diagnosis not present

## 2019-05-06 DIAGNOSIS — Z3A19 19 weeks gestation of pregnancy: Secondary | ICD-10-CM | POA: Diagnosis not present

## 2019-09-09 ENCOUNTER — Other Ambulatory Visit: Payer: Self-pay | Admitting: Obstetrics and Gynecology

## 2019-09-09 ENCOUNTER — Other Ambulatory Visit (HOSPITAL_COMMUNITY)
Admission: RE | Admit: 2019-09-09 | Discharge: 2019-09-09 | Disposition: A | Discharge status: Home / Self Care | Payer: 59 | Source: Ambulatory Visit | Attending: Obstetrics and Gynecology | Admitting: Obstetrics and Gynecology

## 2019-09-09 ENCOUNTER — Encounter (HOSPITAL_COMMUNITY): Payer: Self-pay

## 2019-09-09 ENCOUNTER — Other Ambulatory Visit: Payer: Self-pay

## 2019-09-09 DIAGNOSIS — Z01812 Encounter for preprocedural laboratory examination: Secondary | ICD-10-CM | POA: Insufficient documentation

## 2019-09-09 DIAGNOSIS — Z20828 Contact with and (suspected) exposure to other viral communicable diseases: Secondary | ICD-10-CM | POA: Insufficient documentation

## 2019-09-09 HISTORY — DX: Gastro-esophageal reflux disease without esophagitis: K21.9

## 2019-09-09 LAB — CBC
HCT: 39.3 % (ref 36.0–46.0)
Hemoglobin: 14 g/dL (ref 12.0–15.0)
MCH: 33.3 pg (ref 26.0–34.0)
MCHC: 35.6 g/dL (ref 30.0–36.0)
MCV: 93.3 fL (ref 80.0–100.0)
Platelets: 180 10*3/uL (ref 150–400)
RBC: 4.21 MIL/uL (ref 3.87–5.11)
RDW: 14.2 % (ref 11.5–15.5)
WBC: 9.1 10*3/uL (ref 4.0–10.5)
nRBC: 0 % (ref 0.0–0.2)

## 2019-09-09 LAB — SARS CORONAVIRUS 2 BY RT PCR (HOSPITAL ORDER, PERFORMED IN ~~LOC~~ HOSPITAL LAB): SARS Coronavirus 2: NEGATIVE

## 2019-09-09 NOTE — Patient Instructions (Signed)
Hailey Carter  09/09/2019   Your procedure is scheduled on:  09/09/2019  Arrive at 0800 at Entrance C on [address] at [facility]  and [employer]. You are invited to use the FREE valet parking or use the Visitor's parking deck.  Pick up the phone at the desk and dial [phone].  Call this number if you have problems the morning of surgery: [phone]  Remember:   Do not eat food:(After Midnight) Desps de medianoche.  Do not drink clear liquids: (After Midnight) Desps de medianoche.  Take these medicines the morning of surgery with A SIP OF WATER:  Take your singulair and zoloft as prescribed   Do not wear jewelry, make-up or nail polish.  Do not wear lotions, powders, or perfumes. Do not wear deodorant.  Do not shave 48 hours prior to surgery.  Do not bring valuables to the hospital.  [facility] is not   responsible for any belongings or valuables brought to the hospital.  Contacts, dentures or bridgework may not be worn into surgery.  Leave suitcase in the car. After surgery it may be brought to your room.  For patients admitted to the hospital, checkout time is 11:00 AM the day of              discharge.      Please read over the following fact sheets that you were given:     Preparing for Surgery

## 2019-09-09 NOTE — MAU Note (Signed)
Covid swab collected. Pt tolerated well. Pt asymptomatic 

## 2019-09-10 ENCOUNTER — Other Ambulatory Visit: Payer: Self-pay

## 2019-09-10 ENCOUNTER — Inpatient Hospital Stay (HOSPITAL_COMMUNITY): Payer: 59 | Admitting: Anesthesiology

## 2019-09-10 ENCOUNTER — Encounter (HOSPITAL_COMMUNITY): Payer: Self-pay

## 2019-09-10 ENCOUNTER — Inpatient Hospital Stay (HOSPITAL_COMMUNITY)
Admission: AD | Admit: 2019-09-10 | Discharge: 2019-09-13 | DRG: 787 | Disposition: A | Discharge status: Home / Self Care | Payer: 59 | Attending: Obstetrics and Gynecology | Admitting: Obstetrics and Gynecology

## 2019-09-10 ENCOUNTER — Encounter (HOSPITAL_COMMUNITY)
Admission: AD | Disposition: A | Discharge status: Home / Self Care | Payer: Self-pay | Source: Home / Self Care | Attending: Obstetrics and Gynecology

## 2019-09-10 DIAGNOSIS — O9081 Anemia of the puerperium: Secondary | ICD-10-CM | POA: Diagnosis not present

## 2019-09-10 DIAGNOSIS — O134 Gestational [pregnancy-induced] hypertension without significant proteinuria, complicating childbirth: Secondary | ICD-10-CM | POA: Diagnosis present

## 2019-09-10 DIAGNOSIS — D62 Acute posthemorrhagic anemia: Secondary | ICD-10-CM | POA: Diagnosis not present

## 2019-09-10 DIAGNOSIS — O99214 Obesity complicating childbirth: Secondary | ICD-10-CM | POA: Diagnosis present

## 2019-09-10 DIAGNOSIS — Z3A37 37 weeks gestation of pregnancy: Secondary | ICD-10-CM

## 2019-09-10 DIAGNOSIS — Z23 Encounter for immunization: Secondary | ICD-10-CM | POA: Diagnosis not present

## 2019-09-10 DIAGNOSIS — O3663X0 Maternal care for excessive fetal growth, third trimester, not applicable or unspecified: Secondary | ICD-10-CM | POA: Diagnosis present

## 2019-09-10 DIAGNOSIS — O139 Gestational [pregnancy-induced] hypertension without significant proteinuria, unspecified trimester: Secondary | ICD-10-CM | POA: Diagnosis present

## 2019-09-10 DIAGNOSIS — O26893 Other specified pregnancy related conditions, third trimester: Secondary | ICD-10-CM | POA: Diagnosis present

## 2019-09-10 DIAGNOSIS — Z20828 Contact with and (suspected) exposure to other viral communicable diseases: Secondary | ICD-10-CM | POA: Diagnosis present

## 2019-09-10 DIAGNOSIS — O403XX0 Polyhydramnios, third trimester, not applicable or unspecified: Secondary | ICD-10-CM | POA: Diagnosis present

## 2019-09-10 DIAGNOSIS — Z6791 Unspecified blood type, Rh negative: Secondary | ICD-10-CM

## 2019-09-10 DIAGNOSIS — O321XX0 Maternal care for breech presentation, not applicable or unspecified: Principal | ICD-10-CM | POA: Diagnosis present

## 2019-09-10 LAB — COMPREHENSIVE METABOLIC PANEL
ALT: 13 U/L (ref 0–44)
AST: 21 U/L (ref 15–41)
Albumin: 2.6 g/dL — ABNORMAL LOW (ref 3.5–5.0)
Alkaline Phosphatase: 140 U/L — ABNORMAL HIGH (ref 38–126)
Anion gap: 9 (ref 5–15)
BUN: 9 mg/dL (ref 6–20)
CO2: 17 mmol/L — ABNORMAL LOW (ref 22–32)
Calcium: 8.8 mg/dL — ABNORMAL LOW (ref 8.9–10.3)
Chloride: 111 mmol/L (ref 98–111)
Creatinine, Ser: 0.65 mg/dL (ref 0.44–1.00)
GFR calc Af Amer: >60 mL/min (ref >60)
GFR calc non Af Amer: >60 mL/min (ref >60)
Glucose, Bld: 93 mg/dL (ref 70–99)
Potassium: 3.6 mmol/L (ref 3.5–5.1)
Sodium: 137 mmol/L (ref 135–145)
Total Bilirubin: 0.7 mg/dL (ref 0.3–1.2)
Total Protein: 5.6 g/dL — ABNORMAL LOW (ref 6.5–8.1)

## 2019-09-10 LAB — RPR: RPR Ser Ql: NONREACTIVE

## 2019-09-10 SURGERY — Surgical Case
Anesthesia: Spinal

## 2019-09-10 MED ORDER — HYDROMORPHONE HCL 1 MG/ML IJ SOLN: 0.2500 mg | INTRAMUSCULAR | Status: DC | PRN

## 2019-09-10 MED ORDER — FENTANYL CITRATE (PF) 100 MCG/2ML IJ SOLN
INTRAMUSCULAR | Status: AC
  Filled 2019-09-10: qty 2

## 2019-09-10 MED ORDER — WITCH HAZEL-GLYCERIN EX PADS: 1.0000 "application " | MEDICATED_PAD | CUTANEOUS | Status: DC | PRN

## 2019-09-10 MED ORDER — SODIUM CHLORIDE 0.9 % IV SOLN
INTRAVENOUS | Status: DC | PRN
  Administered 2019-09-10: 10:00:00 60 ug/min via INTRAVENOUS

## 2019-09-10 MED ORDER — OXYTOCIN 40 UNITS IN NORMAL SALINE INFUSION - SIMPLE MED
INTRAVENOUS | Status: DC | PRN
  Administered 2019-09-10: 950 mL via INTRAVENOUS

## 2019-09-10 MED ORDER — METHYLERGONOVINE MALEATE 0.2 MG PO TABS: 0.2000 mg | ORAL_TABLET | ORAL | Status: DC | PRN

## 2019-09-10 MED ORDER — COCONUT OIL OIL
1.0000 "application " | TOPICAL_OIL | Status: DC | PRN
  Administered 2019-09-13: 1 via TOPICAL

## 2019-09-10 MED ORDER — LACTATED RINGERS IV SOLN
INTRAVENOUS | Status: DC
  Administered 2019-09-10 (×2): via INTRAVENOUS

## 2019-09-10 MED ORDER — MEPERIDINE HCL 25 MG/ML IJ SOLN: 6.2500 mg | INTRAMUSCULAR | Status: DC | PRN

## 2019-09-10 MED ORDER — SODIUM CHLORIDE 0.9 % IR SOLN
Status: DC | PRN
  Administered 2019-09-10: 1000 mL

## 2019-09-10 MED ORDER — EPHEDRINE SULFATE 50 MG/ML IJ SOLN
INTRAMUSCULAR | Status: AC
  Filled 2019-09-10: qty 1

## 2019-09-10 MED ORDER — KETOROLAC TROMETHAMINE 30 MG/ML IJ SOLN
INTRAMUSCULAR | Status: AC
  Filled 2019-09-10: qty 1

## 2019-09-10 MED ORDER — DIPHENOXYLATE-ATROPINE 2.5-0.025 MG PO TABS
2.0000 | ORAL_TABLET | Freq: Four times a day (QID) | ORAL | Status: DC
  Administered 2019-09-10: 2 via ORAL

## 2019-09-10 MED ORDER — ONDANSETRON HCL 4 MG/2ML IJ SOLN
INTRAMUSCULAR | Status: DC | PRN
  Administered 2019-09-10 (×2): 4 mg via INTRAVENOUS

## 2019-09-10 MED ORDER — SIMETHICONE 80 MG PO CHEW
80.0000 mg | CHEWABLE_TABLET | Freq: Three times a day (TID) | ORAL | Status: DC
  Administered 2019-09-11 – 2019-09-13 (×4): 80 mg via ORAL
  Filled 2019-09-10 (×7): qty 1

## 2019-09-10 MED ORDER — SOD CITRATE-CITRIC ACID 500-334 MG/5ML PO SOLN
30.0000 mL | Freq: Once | ORAL | Status: AC
  Administered 2019-09-10: 30 mL via ORAL

## 2019-09-10 MED ORDER — FAMOTIDINE 20 MG PO TABS
ORAL_TABLET | ORAL | Status: AC
  Filled 2019-09-10: qty 1

## 2019-09-10 MED ORDER — NALBUPHINE HCL 10 MG/ML IJ SOLN
5.0000 mg | INTRAMUSCULAR | Status: DC | PRN
  Filled 2019-09-10: qty 0.5

## 2019-09-10 MED ORDER — SODIUM CHLORIDE (PF) 0.9 % IJ SOLN
INTRAMUSCULAR | Status: DC | PRN
  Administered 2019-09-10: 20 mL

## 2019-09-10 MED ORDER — KETOROLAC TROMETHAMINE 30 MG/ML IJ SOLN
30.0000 mg | Freq: Four times a day (QID) | INTRAMUSCULAR | Status: AC
  Administered 2019-09-10 – 2019-09-11 (×4): 30 mg via INTRAVENOUS
  Filled 2019-09-10 (×3): qty 1

## 2019-09-10 MED ORDER — LACTATED RINGERS IV SOLN
INTRAVENOUS | Status: DC
  Administered 2019-09-10 – 2019-09-11 (×2): via INTRAVENOUS

## 2019-09-10 MED ORDER — MORPHINE SULFATE (PF) 0.5 MG/ML IJ SOLN
INTRAMUSCULAR | Status: DC | PRN
  Administered 2019-09-10: 150 ug via INTRATHECAL

## 2019-09-10 MED ORDER — PHENYLEPHRINE 40 MCG/ML (10ML) SYRINGE FOR IV PUSH (FOR BLOOD PRESSURE SUPPORT)
PREFILLED_SYRINGE | INTRAVENOUS | Status: AC
  Filled 2019-09-10: qty 10

## 2019-09-10 MED ORDER — PHENYLEPHRINE HCL-NACL 20-0.9 MG/250ML-% IV SOLN
INTRAVENOUS | Status: AC
  Filled 2019-09-10: qty 250

## 2019-09-10 MED ORDER — INFLUENZA VAC SPLIT QUAD 0.5 ML IM SUSY
0.5000 mL | PREFILLED_SYRINGE | INTRAMUSCULAR | Status: DC
  Filled 2019-09-10: qty 0.5

## 2019-09-10 MED ORDER — TRANEXAMIC ACID-NACL 1000-0.7 MG/100ML-% IV SOLN
INTRAVENOUS | Status: AC
  Filled 2019-09-10: qty 100

## 2019-09-10 MED ORDER — SIMETHICONE 80 MG PO CHEW
80.0000 mg | CHEWABLE_TABLET | ORAL | Status: DC
  Administered 2019-09-10 – 2019-09-12 (×3): 80 mg via ORAL
  Filled 2019-09-10 (×3): qty 1

## 2019-09-10 MED ORDER — SCOPOLAMINE 1 MG/3DAYS TD PT72: 1.0000 | MEDICATED_PATCH | Freq: Once | TRANSDERMAL | Status: DC

## 2019-09-10 MED ORDER — BUPIVACAINE HCL (PF) 0.25 % IJ SOLN
INTRAMUSCULAR | Status: AC
  Filled 2019-09-10: qty 30

## 2019-09-10 MED ORDER — DEXAMETHASONE SODIUM PHOSPHATE 10 MG/ML IJ SOLN
INTRAMUSCULAR | Status: DC | PRN
  Administered 2019-09-10: 8 mg via INTRAVENOUS

## 2019-09-10 MED ORDER — ONDANSETRON HCL 4 MG/2ML IJ SOLN: 4.0000 mg | Freq: Three times a day (TID) | INTRAMUSCULAR | Status: DC | PRN

## 2019-09-10 MED ORDER — EPHEDRINE SULFATE 50 MG/ML IJ SOLN
INTRAMUSCULAR | Status: DC | PRN
  Administered 2019-09-10 (×5): 5 mg via INTRAVENOUS

## 2019-09-10 MED ORDER — NALBUPHINE HCL 10 MG/ML IJ SOLN
5.0000 mg | Freq: Once | INTRAMUSCULAR | Status: DC | PRN
  Filled 2019-09-10: qty 0.5

## 2019-09-10 MED ORDER — DIPHENHYDRAMINE HCL 50 MG/ML IJ SOLN: 12.5000 mg | Freq: Four times a day (QID) | INTRAMUSCULAR | Status: DC | PRN

## 2019-09-10 MED ORDER — SODIUM CHLORIDE 0.9% FLUSH: 3.0000 mL | INTRAVENOUS | Status: DC | PRN

## 2019-09-10 MED ORDER — DIPHENHYDRAMINE HCL 25 MG PO CAPS: 25.0000 mg | ORAL_CAPSULE | ORAL | Status: DC | PRN

## 2019-09-10 MED ORDER — BUPIVACAINE HCL (PF) 0.25 % IJ SOLN
INTRAMUSCULAR | Status: DC | PRN
  Administered 2019-09-10: 15 mL

## 2019-09-10 MED ORDER — MORPHINE SULFATE (PF) 0.5 MG/ML IJ SOLN
INTRAMUSCULAR | Status: AC
  Filled 2019-09-10: qty 10

## 2019-09-10 MED ORDER — CARBOPROST TROMETHAMINE 250 MCG/ML IM SOLN
INTRAMUSCULAR | Status: DC | PRN
  Administered 2019-09-10: 250 ug via INTRAMUSCULAR

## 2019-09-10 MED ORDER — PRENATAL MULTIVITAMIN CH
1.0000 | ORAL_TABLET | Freq: Every day | ORAL | Status: DC
  Administered 2019-09-11 – 2019-09-13 (×3): 1 via ORAL
  Filled 2019-09-10 (×3): qty 1

## 2019-09-10 MED ORDER — ACETAMINOPHEN 500 MG PO TABS
ORAL_TABLET | ORAL | Status: AC
  Filled 2019-09-10: qty 2

## 2019-09-10 MED ORDER — SIMETHICONE 80 MG PO CHEW
80.0000 mg | CHEWABLE_TABLET | ORAL | Status: DC | PRN
  Administered 2019-09-12 (×2): 80 mg via ORAL

## 2019-09-10 MED ORDER — SOD CITRATE-CITRIC ACID 500-334 MG/5ML PO SOLN
ORAL | Status: AC
  Filled 2019-09-10: qty 30

## 2019-09-10 MED ORDER — ACETAMINOPHEN 500 MG PO TABS
1000.0000 mg | ORAL_TABLET | Freq: Once | ORAL | Status: AC
  Administered 2019-09-10: 1000 mg via ORAL

## 2019-09-10 MED ORDER — FAMOTIDINE 20 MG PO TABS
20.0000 mg | ORAL_TABLET | Freq: Once | ORAL | Status: AC
  Administered 2019-09-10: 20 mg via ORAL

## 2019-09-10 MED ORDER — ZOLPIDEM TARTRATE 5 MG PO TABS: 5.0000 mg | ORAL_TABLET | Freq: Every evening | ORAL | Status: DC | PRN

## 2019-09-10 MED ORDER — OXYTOCIN 40 UNITS IN NORMAL SALINE INFUSION - SIMPLE MED
INTRAVENOUS | Status: AC
  Filled 2019-09-10: qty 1000

## 2019-09-10 MED ORDER — KETOROLAC TROMETHAMINE 30 MG/ML IJ SOLN: 30.0000 mg | Freq: Once | INTRAMUSCULAR | Status: DC | PRN

## 2019-09-10 MED ORDER — OXYTOCIN 40 UNITS IN NORMAL SALINE INFUSION - SIMPLE MED: 2.5000 [IU]/h | INTRAVENOUS | Status: AC

## 2019-09-10 MED ORDER — NALOXONE HCL 0.4 MG/ML IJ SOLN: 0.4000 mg | INTRAMUSCULAR | Status: DC | PRN

## 2019-09-10 MED ORDER — NALOXONE HCL 4 MG/10ML IJ SOLN
1.0000 ug/kg/h | INTRAVENOUS | Status: DC | PRN
  Filled 2019-09-10: qty 5

## 2019-09-10 MED ORDER — MENTHOL 3 MG MT LOZG: 1.0000 | LOZENGE | OROMUCOSAL | Status: DC | PRN

## 2019-09-10 MED ORDER — FENTANYL CITRATE (PF) 100 MCG/2ML IJ SOLN
INTRAMUSCULAR | Status: DC | PRN
  Administered 2019-09-10: 15 ug via INTRATHECAL

## 2019-09-10 MED ORDER — BUPIVACAINE IN DEXTROSE 0.75-8.25 % IT SOLN
INTRATHECAL | Status: DC | PRN
  Administered 2019-09-10: 1.7 mL via INTRATHECAL

## 2019-09-10 MED ORDER — SODIUM CHLORIDE (PF) 0.9 % IJ SOLN
INTRAMUSCULAR | Status: AC
  Filled 2019-09-10: qty 20

## 2019-09-10 MED ORDER — SERTRALINE HCL 50 MG PO TABS
50.0000 mg | ORAL_TABLET | Freq: Every day | ORAL | Status: DC
  Administered 2019-09-10 – 2019-09-12 (×3): 50 mg via ORAL
  Filled 2019-09-10 (×4): qty 1

## 2019-09-10 MED ORDER — DIPHENHYDRAMINE HCL 25 MG PO CAPS
25.0000 mg | ORAL_CAPSULE | Freq: Four times a day (QID) | ORAL | Status: DC | PRN
  Administered 2019-09-11: 25 mg via ORAL
  Filled 2019-09-10: qty 1

## 2019-09-10 MED ORDER — DEXTROSE 5 % IV SOLN
3.0000 g | INTRAVENOUS | Status: AC
  Administered 2019-09-10: 10:00:00 3 g via INTRAVENOUS
  Filled 2019-09-10: qty 3000

## 2019-09-10 MED ORDER — IBUPROFEN 800 MG PO TABS
800.0000 mg | ORAL_TABLET | Freq: Four times a day (QID) | ORAL | Status: DC
  Administered 2019-09-11 – 2019-09-13 (×8): 800 mg via ORAL
  Filled 2019-09-10 (×8): qty 1

## 2019-09-10 MED ORDER — PROMETHAZINE HCL 25 MG/ML IJ SOLN: 6.2500 mg | INTRAMUSCULAR | Status: DC | PRN

## 2019-09-10 MED ORDER — DIBUCAINE (PERIANAL) 1 % EX OINT: 1.0000 "application " | TOPICAL_OINTMENT | CUTANEOUS | Status: DC | PRN

## 2019-09-10 MED ORDER — OXYCODONE-ACETAMINOPHEN 5-325 MG PO TABS
1.0000 | ORAL_TABLET | ORAL | Status: DC | PRN
  Administered 2019-09-11: 1 via ORAL
  Filled 2019-09-10: qty 1

## 2019-09-10 MED ORDER — SENNOSIDES-DOCUSATE SODIUM 8.6-50 MG PO TABS
2.0000 | ORAL_TABLET | ORAL | Status: DC
  Administered 2019-09-10 – 2019-09-12 (×3): 2 via ORAL
  Filled 2019-09-10 (×3): qty 2

## 2019-09-10 MED ORDER — TETANUS-DIPHTH-ACELL PERTUSSIS 5-2.5-18.5 LF-MCG/0.5 IM SUSP: 0.5000 mL | Freq: Once | INTRAMUSCULAR | Status: DC

## 2019-09-10 MED ORDER — DIPHENOXYLATE-ATROPINE 2.5-0.025 MG PO TABS
ORAL_TABLET | ORAL | Status: AC
  Filled 2019-09-10: qty 1

## 2019-09-10 MED ORDER — METHYLERGONOVINE MALEATE 0.2 MG/ML IJ SOLN: 0.2000 mg | INTRAMUSCULAR | Status: DC | PRN

## 2019-09-10 SURGICAL SUPPLY — 42 items
APL SKNCLS STERI-STRIP NONHPOA (GAUZE/BANDAGES/DRESSINGS) ×1
BENZOIN TINCTURE PRP APPL 2/3 (GAUZE/BANDAGES/DRESSINGS) ×2 IMPLANT
CHLORAPREP W/TINT 26ML (MISCELLANEOUS) ×3 IMPLANT
CLAMP CORD UMBIL (MISCELLANEOUS) IMPLANT
CLOSURE STERI STRIP 1/2 X4 (GAUZE/BANDAGES/DRESSINGS) ×2 IMPLANT
CLOTH BEACON ORANGE TIMEOUT ST (SAFETY) ×3 IMPLANT
DRSG OPSITE POSTOP 4X10 (GAUZE/BANDAGES/DRESSINGS) ×3 IMPLANT
ELECT REM PT RETURN 9FT ADLT (ELECTROSURGICAL) ×3
ELECTRODE REM PT RTRN 9FT ADLT (ELECTROSURGICAL) ×1 IMPLANT
EXTRACTOR VACUUM M CUP 4 TUBE (SUCTIONS) IMPLANT
EXTRACTOR VACUUM M CUP 4' TUBE (SUCTIONS)
GAUZE SPONGE 4X4 12PLY STRL LF (GAUZE/BANDAGES/DRESSINGS) ×4 IMPLANT
GLOVE BIO SURGEON STRL SZ7.5 (GLOVE) ×3 IMPLANT
GLOVE BIOGEL PI IND STRL 7.0 (GLOVE) ×1 IMPLANT
GLOVE BIOGEL PI INDICATOR 7.0 (GLOVE) ×2
GOWN STRL REUS W/TWL LRG LVL3 (GOWN DISPOSABLE) ×6 IMPLANT
KIT ABG SYR 3ML LUER SLIP (SYRINGE) IMPLANT
NDL HYPO 25X5/8 SAFETYGLIDE (NEEDLE) IMPLANT
NDL SPNL 20GX3.5 QUINCKE YW (NEEDLE) IMPLANT
NEEDLE HYPO 22GX1.5 SAFETY (NEEDLE) ×3 IMPLANT
NEEDLE HYPO 25X5/8 SAFETYGLIDE (NEEDLE) IMPLANT
NEEDLE SPNL 20GX3.5 QUINCKE YW (NEEDLE) IMPLANT
NS IRRIG 1000ML POUR BTL (IV SOLUTION) ×3 IMPLANT
PACK C SECTION WH (CUSTOM PROCEDURE TRAY) ×3 IMPLANT
PAD ABD 7.5X8 STRL (GAUZE/BANDAGES/DRESSINGS) ×2 IMPLANT
PENCIL SMOKE EVAC W/HOLSTER (ELECTROSURGICAL) ×3 IMPLANT
SUT CHROMIC 1 CTX 36 (SUTURE) ×6 IMPLANT
SUT MNCRL 0 VIOLET CTX 36 (SUTURE) ×2 IMPLANT
SUT MNCRL AB 3-0 PS2 27 (SUTURE) IMPLANT
SUT MON AB 2-0 CT1 27 (SUTURE) ×3 IMPLANT
SUT MON AB-0 CT1 36 (SUTURE) ×6 IMPLANT
SUT MONOCRYL 0 CTX 36 (SUTURE) ×4
SUT PLAIN 0 NONE (SUTURE) IMPLANT
SUT PLAIN 2 0 (SUTURE)
SUT PLAIN 2 0 XLH (SUTURE) IMPLANT
SUT PLAIN ABS 2-0 CT1 27XMFL (SUTURE) IMPLANT
SUT VIC AB 4-0 KS 27 (SUTURE) ×2 IMPLANT
SYR 20CC LL (SYRINGE) IMPLANT
SYR CONTROL 10ML LL (SYRINGE) ×3 IMPLANT
TOWEL OR 17X24 6PK STRL BLUE (TOWEL DISPOSABLE) ×3 IMPLANT
TRAY FOLEY W/BAG SLVR 14FR LF (SET/KITS/TRAYS/PACK) ×3 IMPLANT
WATER STERILE IRR 1000ML POUR (IV SOLUTION) ×3 IMPLANT

## 2019-09-10 NOTE — Transfer of Care (Signed)
Immediate Anesthesia Transfer of Care Note  Patient: Hailey Carter  Procedure(s) Performed: Primary CESAREAN SECTION (N/A )  Patient Location: PACU  Anesthesia Type:Spinal  Level of Consciousness: awake, alert , oriented and patient cooperative  Airway & Oxygen Therapy: Patient Spontanous Breathing  Post-op Assessment: Report given to RN and Post -op Vital signs reviewed and stable  Post vital signs: Reviewed and stable  Last Vitals:  Vitals Value Taken Time  BP 120/67 09/10/19 1400  Temp 36.5 C 09/10/19 1400  Pulse 91 09/10/19 1400  Resp 18 09/10/19 1400  SpO2 96 % 09/10/19 1400    Last Pain:  Vitals:   09/10/19 1520  TempSrc:   PainSc: 4          Complications: No apparent anesthesia complications

## 2019-09-10 NOTE — Progress Notes (Signed)
MOB was referred for history of depression/anxiety. * Referral screened out by Clinical Social Worker because none of the following criteria appear to apply: ~ History of anxiety/depression during this pregnancy, or of post-partum depression following prior delivery. ~ Diagnosis of anxiety and/or depression within last 3 years OR * MOB's symptoms currently being treated with medication and/or therapy. Per OB records and chart review, MOB is currently prescribed/taking Zoloft.   Please contact the Clinical Social Worker if needs arise, by [facility] request, or if MOB scores greater than 9/yes to question 10 on Edinburgh Postpartum Depression Screen.  Abundio Miu, Hope Worker [facility] Cell#: [phone]

## 2019-09-10 NOTE — Op Note (Signed)
Cesarean Section Procedure Note  Indications: malpresentation: frank breech and Gestational HTN, LGA  Pre-operative Diagnosis: 37 week 5 day pregnancy.  Post-operative Diagnosis: same  Surgeon: Lovenia Kim   Assistants: Eddie Dibbles, CNM  Anesthesia: Local anesthesia 0.25.% bupivacaine and Spinal anesthesia  ASA Class: 2  Procedure Details  The patient was seen in the Holding Room. The risks, benefits, complications, treatment options, and expected outcomes were discussed with the patient.  The patient concurred with the proposed plan, giving informed consent. The risks of anesthesia, infection, bleeding and possible injury to other organs discussed. Injury to bowel, bladder, or ureter with possible need for repair discussed. Possible need for transfusion with secondary risks of hepatitis or HIV acquisition discussed. Post operative complications to include but not limited to DVT, PE and Pneumonia noted. The site of surgery properly noted/marked. The patient was taken to Operating Room # C, identified as Hailey Carter and the procedure verified as C-Section Delivery. A Time Out was held and the above information confirmed.  After induction of anesthesia, the patient was draped and prepped in the usual sterile manner. A Pfannenstiel incision was made and carried down through the subcutaneous tissue to the fascia. Fascial incision was made and extended transversely using Mayo scissors. The fascia was separated from the underlying rectus tissue superiorly and inferiorly. The peritoneum was identified and entered. Peritoneal incision was extended longitudinally. The utero-vesical peritoneal reflection was incised transversely and the bladder flap was bluntly freed from the lower uterine segment. A low transverse uterine incision(Kerr hysterotomy) was made. Delivered from frank breech presentation was a  female with Apgar scores of 8 at one minute and 9 at five minutes. Bulb suctioning gently performed.  Neonatal team in attendance.After the umbilical cord was clamped and cut cord blood was obtained for evaluation. The placenta was removed intact and appeared normal. The uterus was curetted with a dry lap pack. Good hemostasis was noted.The uterine outline, tubes and ovaries appeared normal. The uterine incision was closed with running locked sutures of 0 Monocryl x 2 layers. Hemostasis was observed. Marked uterine atony at fundus despite IV Pitocin, IM hemabate and Intra myometrial  Hemabate. Compression mattress/box sutures with 1-0 chromic placed in fundus without complications.  Lavage was carried out until clear.The parietal peritoneum was closed with a running 2-0 Monocryl suture. The fascia was then reapproximated with running sutures of 0 Monocryl. The skin was reapproximated with 4-0 vicryl after [place] closure with 2-0 plain.  Instrument, sponge, and needle counts were correct prior the abdominal closure and at the conclusion of the case.   Findings: FTLM, frank breech, anterior placenta. PP atony managed as above  Estimated Blood Loss:  600         Drains: foley                 Specimens: placenta                 Complications:  None; patient tolerated the procedure well.         Disposition: PACU - hemodynamically stable.         Condition: stable  Attending Attestation: I performed the procedure.

## 2019-09-10 NOTE — Anesthesia Preprocedure Evaluation (Addendum)
Anesthesia Evaluation  Patient identified by MRN, date of birth, ID band Patient awake    Reviewed: Allergy & Precautions, NPO status , Patient's Chart, lab work & pertinent test results  History of Anesthesia Complications (+) PONV and history of anesthetic complications  Airway Mallampati: II  TM Distance: >3 FB Neck ROM: Full    Dental no notable dental hx.    Pulmonary neg pulmonary ROS,    Pulmonary exam normal breath sounds clear to auscultation       Cardiovascular negative cardio ROS Normal cardiovascular exam Rhythm:Regular Rate:Normal     Neuro/Psych  Headaches, PSYCHIATRIC DISORDERS Anxiety Depression    GI/Hepatic negative GI ROS, Neg liver ROS,   Endo/Other  Morbid obesity  Renal/GU negative Renal ROS     Musculoskeletal negative musculoskeletal ROS (+) Right leg swelling     Abdominal   Peds  Hematology negative hematology ROS (+)   Anesthesia Other Findings Gestational Hypertension, Breech, Large for Gestational Age (Estimated Fetal Weight >10lbs)  Reproductive/Obstetrics (+) Pregnancy                            Anesthesia Physical Anesthesia Plan  ASA: II  Anesthesia Plan: Spinal   Post-op Pain Management:    Induction:   PONV Risk Score and Plan: Ondansetron, Dexamethasone and Treatment may vary due to age or medical condition  Airway Management Planned: Natural Airway  Additional Equipment:   Intra-op Plan:   Post-operative Plan:   Informed Consent: I have reviewed the patients History and Physical, chart, labs and discussed the procedure including the risks, benefits and alternatives for the proposed anesthesia with the patient or authorized representative who has indicated his/her understanding and acceptance.     Dental advisory given  Plan Discussed with: CRNA  Anesthesia Plan Comments:        Anesthesia Quick Evaluation

## 2019-09-10 NOTE — Anesthesia Postprocedure Evaluation (Signed)
Anesthesia Post Note  Patient: Hailey Carter  Procedure(s) Performed: Primary CESAREAN SECTION (N/A )     Patient location during evaluation: PACU Anesthesia Type: Spinal Level of consciousness: oriented and awake and alert Pain management: pain level controlled Vital Signs Assessment: post-procedure vital signs reviewed and stable Respiratory status: spontaneous breathing, respiratory function stable and patient connected to nasal cannula oxygen Cardiovascular status: blood pressure returned to baseline and stable Postop Assessment: no headache, no backache, no apparent nausea or vomiting and spinal receding Anesthetic complications: no    Last Vitals:  Vitals:   09/10/19 1400 09/10/19 1825  BP: 120/67 122/72  Pulse: 91 73  Resp: 18 18  Temp: 36.5 C 36.6 C  SpO2: 96% 98%    Last Pain:  Vitals:   09/10/19 1825  TempSrc: Oral  PainSc: 0-No pain   Pain Goal:                   Karyl Kinnier Ellender

## 2019-09-10 NOTE — H&P (Signed)
[name] is a 43 y.o. female presenting for primary csection for gestational htn, Breech and EFW 5000gms. OB History    Gravida  5   Para      Term      Preterm      AB  4   Living        SAB  3   TAB      Ectopic  1   Multiple      Live Births             Past Medical History:  Diagnosis Date  . Anxiety   . Complication of anesthesia   . Depression   . GERD (gastroesophageal reflux disease)   . Headache    Migraines  . Leg swelling    right leg recently had surgical procedure  . PONV (postoperative nausea and vomiting)    Past Surgical History:  Procedure Laterality Date  . CHOLECYSTECTOMY    . COLONOSCOPY    . DILATION AND EVACUATION N/A 09/17/2017   Procedure: DILATATION AND EVACUATION;  Surgeon: Olivia Mackieaavon, Enma Maeda, MD;  Location: [facility];  Service: Gynecology;  Laterality: N/A;  . ECTOPIC PREGNANCY SURGERY    . IM NAILING TIBIA Right 07/10/2017  . ORIF ANKLE FRACTURE Right 07/10/2017  . TIBIA IM NAIL INSERTION Right 07/10/2017   Procedure: INTRAMEDULLARY (IM) NAIL TIBIAL ORIF ANKLE FRACTURE;  Surgeon: Cammy Copaean, Scott Gregory, MD;  [facility];  Service: Orthopedics;  Laterality: Right;  . WISDOM TOOTH EXTRACTION     Family History: family history includes Diabetes in her mother; Hypertension in her mother. Social History:  reports that she has never smoked. She has never used smokeless tobacco. She reports that she does not drink alcohol or use drugs.     Maternal Diabetes: No Genetic Screening: Normal Maternal Ultrasounds/Referrals: Other: Fetal Ultrasounds or other Referrals:  None Maternal Substance Abuse:  No Significant Maternal Medications:  None Significant Maternal Lab Results:  Group B Strep negative Other Comments:  None  Review of Systems  Constitutional: Negative.   All other systems reviewed and are negative.  Maternal Medical History:  Fetal activity: Perceived fetal activity is normal.   Last perceived fetal movement was  within the past hour.    Prenatal complications: PIH and polyhydramnios.   Prenatal Complications - Diabetes: none.      Blood pressure (!) 148/90, pulse (!) 101, temperature 98.1 F (36.7 C), temperature source Oral, resp. rate 18, height 5\' 9"  (1.753 m), weight 124.3 kg, SpO2 99 %. Maternal Exam:  Uterine Assessment: Contraction strength is mild.  Contraction frequency is irregular.   Abdomen: Patient reports no abdominal tenderness. Fetal presentation: breech  Introitus: Normal vulva. Normal vagina.  Ferning test: not done.  Nitrazine test: not done. Amniotic fluid character: not assessed.  Pelvis: questionable for delivery.   Cervix: Cervix evaluated by digital exam.     Physical Exam  Nursing note and vitals reviewed. Constitutional: She is oriented to person, place, and time. She appears well-developed and well-nourished.  HENT:  Head: Normocephalic and atraumatic.  Neck: Normal range of motion. Neck supple.  Cardiovascular: Normal rate and regular rhythm.  Respiratory: Effort normal and breath sounds normal.  GI: Soft. Bowel sounds are normal.  Genitourinary:    Vulva, vagina and uterus normal.   Musculoskeletal: Normal range of motion.        General: Tenderness present.  Neurological: She is alert and oriented to person, place, and time. She has normal reflexes.  Skin:  Skin is warm and dry.  Psychiatric: She has a normal mood and affect.    Prenatal labs: ABO, Rh: --/--/A NEG (09/18 1226) Antibody: POS (09/18 1226) Rubella:  neg RPR:   neg HBsAg:   neg HIV:   neg GBS:   neg  Assessment/Plan: 37+ weeks Breech LGA Gestational HTN Primary csection Consent done, surgical risks discussed.   Deniel Mcquiston J 09/10/2019, 8:40 AM

## 2019-09-10 NOTE — Anesthesia Procedure Notes (Signed)
Spinal  Patient location during procedure: OR Start time: 09/10/2019 10:10 AM End time: 09/10/2019 10:20 AM Staffing Anesthesiologist: Murvin Natal, MD Performed: anesthesiologist  Preanesthetic Checklist Completed: patient identified, surgical consent, pre-op evaluation, timeout performed, IV checked, risks and benefits discussed and monitors and equipment checked Spinal Block Patient position: sitting Prep: DuraPrep Patient monitoring: cardiac monitor, continuous pulse ox and blood pressure Approach: midline Location: L4-5 Injection technique: single-shot Needle Needle type: Pencan  Needle gauge: 24 G Needle length: 9 cm Assessment Sensory level: T10 Additional Notes Functioning IV was confirmed and monitors were applied. Sterile prep and drape, including hand hygiene and sterile gloves were used. The patient was positioned and the spine was prepped. The skin was anesthetized with lidocaine.  Free flow of clear CSF was obtained prior to injecting local anesthetic into the CSF.  The spinal needle aspirated freely following injection.  The needle was carefully withdrawn.  The patient tolerated the procedure well.

## 2019-09-11 LAB — CBC
HCT: 31.4 % — ABNORMAL LOW (ref 36.0–46.0)
Hemoglobin: 10.4 g/dL — ABNORMAL LOW (ref 12.0–15.0)
MCH: 31.8 pg (ref 26.0–34.0)
MCHC: 33.1 g/dL (ref 30.0–36.0)
MCV: 96 fL (ref 80.0–100.0)
Platelets: 138 10*3/uL — ABNORMAL LOW (ref 150–400)
RBC: 3.27 MIL/uL — ABNORMAL LOW (ref 3.87–5.11)
RDW: 14 % (ref 11.5–15.5)
WBC: 10.6 10*3/uL — ABNORMAL HIGH (ref 4.0–10.5)
nRBC: 0 % (ref 0.0–0.2)

## 2019-09-11 MED ORDER — RHO D IMMUNE GLOBULIN 1500 UNIT/2ML IJ SOSY
300.0000 ug | PREFILLED_SYRINGE | Freq: Once | INTRAMUSCULAR | Status: AC
  Administered 2019-09-11: 300 ug via INTRAVENOUS
  Filled 2019-09-11: qty 2

## 2019-09-11 MED ORDER — INFLUENZA VAC SPLIT QUAD 0.5 ML IM SUSY
0.5000 mL | PREFILLED_SYRINGE | INTRAMUSCULAR | Status: AC
  Administered 2019-09-11: 0.5 mL via INTRAMUSCULAR
  Filled 2019-09-11: qty 0.5

## 2019-09-11 NOTE — Lactation Note (Signed)
This note was copied from a baby's chart. Lactation Consultation Note Baby is 34 hrs old. Mom stated that baby BF for almost an hour, but has now slept several hours and has no interest in feeding at this time. Mom is holding baby STS. LC taught hand expression w/a few drops of colostrum noted. Mom has severe generalized edema.  Breast are cone shaped, full feeling. Bulbous areola short shaft everted nipples. Mom shown how to use DEBP & how to disassemble, clean, & reassemble parts.Mom knows to pump q3h for 15-20 min. Mom encouraged to feed baby 8-12 times/24 hours and with feeding cues. Wake baby if hasn't cued in 3 hrs for feeding. Baby birth wt. 11.1lbs has had 4% wt loss in 19 hrs. Baby has had 4 voids, 3 stools and several spit ups. Baby is also DAT +. Discussed w/mom possibility may need to supplement d/t LGA and DAT +.  Encouraged to pump Q3 hrs for stimulation then hand express afterwards for colostrum and give to baby.  LC unable to collect colostrum other than a few drops, baby may not be satisfied on the breast may need supplemented. Mom states understands. Sometimes Very large baby's need a little something more after BF.  Stressed importance of I&O documentation.  Newborn behavior, feeding habits, STS, breast massage, supply and demand discussed. Discussed milk storage and composition of mature milk. Encouraged mom to call for assistance or questions. Lactation brochure given.   Patient Name: Boy Delsie Amador [id number] Date: 09/11/2019 Reason for consult: Initial assessment;Early term 37-38.6wks;Primapara   Maternal Data Has patient been taught Hand Expression?: Yes Does the patient have breastfeeding experience prior to this delivery?: No  Feeding Feeding Type: Breast Fed  LATCH Score       Type of Nipple: Everted at rest and after stimulation  Comfort (Breast/Nipple): Soft / non-tender        Interventions Interventions: Breast feeding basics reviewed;DEBP;Skin  to skin;Breast massage;Hand express;Breast compression  Lactation Tools Discussed/Used Tools: Pump Breast pump type: Double-Electric Breast Pump WIC Program: No Pump Review: Setup, frequency, and cleaning;Milk Storage Initiated by:: Allayne Stack RN IBCLC Date initiated:: 09/11/19   Consult Status Consult Status: Follow-up Date: 09/12/19 Follow-up type: In-patient    Theodoro Kalata 09/11/2019, 6:14 AM

## 2019-09-11 NOTE — Progress Notes (Signed)
Subjective: Postpartum Day 1: Cesarean Delivery Patient reports nausea, incisional pain, tolerating PO, + flatus and no problems voiding.    Objective: Vital signs in last 24 hours: Temp:  [97.7 F (36.5 C)-98.5 F (36.9 C)] 98.5 F (36.9 C) (09/19 2220) Pulse Rate:  [73-102] 83 (09/20 0222) Resp:  [12-27] 18 (09/20 0222) BP: (93-133)/(63-87) 104/63 (09/20 0222) SpO2:  [96 %-99 %] 97 % (09/19 2220)  Physical Exam:  General: alert, cooperative and appears stated age Lochia: appropriate Uterine Fundus: firm Incision: healing well, no significant drainage DVT Evaluation: Negative Homan's sign. No cords or calf tenderness.  Recent Labs    09/09/19 1226 09/11/19 0438  HGB 14.0 10.4*  HCT 39.3 31.4*    Assessment/Plan: Status post Cesarean section. Doing well postoperatively.  Gestational HTN stable Continue current care.  Bina Veenstra J 09/11/2019, 11:20 AM

## 2019-09-12 LAB — RH IG WORKUP (INCLUDES ABO/RH)
ABO/RH(D): A NEG
Fetal Screen: NEGATIVE
Gestational Age(Wks): 37.5
Unit division: 0

## 2019-09-12 MED ORDER — POLYSACCHARIDE IRON COMPLEX 150 MG PO CAPS
150.0000 mg | ORAL_CAPSULE | Freq: Every day | ORAL | Status: DC
  Administered 2019-09-12 – 2019-09-13 (×2): 150 mg via ORAL
  Filled 2019-09-12 (×2): qty 1

## 2019-09-12 MED ORDER — MAGNESIUM OXIDE 400 (241.3 MG) MG PO TABS
400.0000 mg | ORAL_TABLET | Freq: Every day | ORAL | Status: DC
  Administered 2019-09-12 – 2019-09-13 (×2): 400 mg via ORAL
  Filled 2019-09-12 (×2): qty 1

## 2019-09-12 NOTE — Progress Notes (Signed)
POSTOPERATIVE DAY # 2 S/P Primary LTCS for breech, fetal macrosomia, gestational hypertension, baby boy "Theron Arista"   S:         Reports feeling better today, but tired because baby was cluster feeding all night   Denies HA, visual changes, RUQ/epigastric pain              Tolerating po intake / no nausea / no vomiting / + flatus / no BM  Denies dizziness, SOB, or CP             Bleeding is light             Pain controlled with Motrin and Percocet             Up ad lib / ambulatory/ voiding QS  Newborn breast feeding - going okay, reports baby has had significant weight loss; seeing some colostrum, but has needed help with latch and positioning  / Circumcision - completed    O:  VS: BP 128/78 (BP Location: Right Arm)   Pulse 79   Temp 98.7 F (37.1 C) (Oral)   Resp 17   Ht 5\' 9"  (1.753 m)   Wt 124.3 kg   SpO2 99%   Breastfeeding Unknown   BMI 40.46 kg/m  09/12/19 0636  -  79  -  17  128/78  Sitting  -  -  - BM   09/11/19 2210  98.7 F (37.1 C)  85  -  18  115/66  Sitting  -  -  - BM   09/11/19 1430  98.2 F (36.8 C)  89  -  18  131/77  Sitting  99 %  -  - BH   09/11/19 0222  -  83  -  18  104/63  High-fowlers             LABS:               Recent Labs    09/09/19 1226 09/11/19 0438  WBC 9.1 10.6*  HGB 14.0 10.4*  PLT 180 138*               Bloodtype: --/--/A NEG (09/20 0438)  Rubella:                                               I&O: Intake/Output      09/20 0701 - 09/21 0700 09/21 0701 - 09/22 0700   I.V. (mL/kg)     Total Intake(mL/kg)     Urine (mL/kg/hr) 500 (0.2)    Blood     Total Output 500    Net -500                      Physical Exam:             Alert and Oriented X3  Lungs: Clear and unlabored  Heart: regular rate and rhythm / no murmurs  Abdomen: soft, non-tender, mild gaseous distention, +active bowel sounds in all quadrants             Fundus: firm, non-tender, U-1             Dressing: honeycomb with steristrips c/d/i   Incision:  approximated with sutures / no erythema / no ecchymosis / no drainage  Perineum: intact  Lochia: small rubra on pad  Extremities: +1 LE edema, no calf pain or tenderness,   A/P:     POD # 2 S/P Primary LTCS            Gestational hypertension    - BPs WNL since delivery   - No neural s/s or evidence of PEC  ABL Anemia    - Being Niferex 150mg  PO daily   - magnesium oxide 400mg  PO daily   RH Negative - Baby A Positive    - s/p Rhogam 9/20   Routine postoperative care              Continue working with lactation  Encouraged to rest when baby rests  Anticipate d/c home tomorrow   Lars Pinks, MSN, [facility]

## 2019-09-12 NOTE — Lactation Note (Signed)
This note was copied from a baby's chart. Lactation Consultation Note  Patient Name: Hailey Carter [id number] Date: 09/12/2019 Reason for consult: Follow-up assessment;Early term 37-38.6wks;Primapara Baby is 51 hours old/8% weight loss.  Baby has been cluster feeding.  Mom is working on baby obtaining more depth.  She feels it is improving.  Baby gets sleepy at breast.  Reviewed waking techniques and breast massage during feedings.  Mom trying to post pump some but finds it is difficult with cluster feeding.  Baby recently fed and it sleeping in mother's arms.  Instructed to feed with cues and call for latch assist.  Mom agreeable.  Maternal Data    Feeding Feeding Type: Breast Fed  LATCH Score                   Interventions    Lactation Tools Discussed/Used     Consult Status Consult Status: Follow-up Date: 09/13/19 Follow-up type: In-patient    Ave Filter 09/12/2019, 2:24 PM

## 2019-09-12 NOTE — Lactation Note (Signed)
This note was copied from a baby's chart. Lactation Consultation Note  Patient Name: Hailey Carter [id number] Date: 09/12/2019   P1, 74 hour female infant, LGA greater than 11 lbs, DAT+ . LC entered room, Nurse was in room, Mom and infant were  asleep. Per Nurse, infant  been cluster fed through out the nigh, on yellow sheet t documented 12 or more feedings. LC unsure of I & O due to mom being asleep at this time nor was a latch observed..  Per Nurse,  infant not been supplemented  with EBM/ and or formula throughout the night mom been putting infant to breast.. Mom been latching infant to breast and not used DEBP last night.  [facility] services with follow up with mom later today regarding breastfeeding.   Maternal Data    Feeding Feeding Type: Breast Fed(per dad, on/off)  LATCH Score                   Interventions    Lactation Tools Discussed/Used     Consult Status      Vicente Serene 09/12/2019, 5:25 AM

## 2019-09-12 NOTE — Progress Notes (Signed)
Pt removed pressure dressing in the shower. The patient only had adhesive steri strips, RN applied a honeycomb dressing.

## 2019-09-13 LAB — TYPE AND SCREEN
ABO/RH(D): A NEG
Antibody Screen: POSITIVE
Unit division: 0
Unit division: 0

## 2019-09-13 LAB — BPAM RBC
Blood Product Expiration Date: 202010122359
Blood Product Expiration Date: 202010142359
Unit Type and Rh: 600
Unit Type and Rh: 600

## 2019-09-13 MED ORDER — OXYCODONE-ACETAMINOPHEN 5-325 MG PO TABS: 1.0000 | ORAL_TABLET | ORAL | 0 refills | Status: AC | PRN

## 2019-09-13 MED ORDER — POLYSACCHARIDE IRON COMPLEX 150 MG PO CAPS: 150.0000 mg | ORAL_CAPSULE | Freq: Every day | ORAL | 0 refills | Status: AC

## 2019-09-13 MED ORDER — IBUPROFEN 800 MG PO TABS: 800.0000 mg | ORAL_TABLET | Freq: Four times a day (QID) | ORAL | 0 refills | Status: AC

## 2019-09-13 NOTE — Lactation Note (Signed)
This note was copied from a baby's chart. Lactation Consultation Note  Patient Name: Boy Zion Lint [id number] Date: 09/13/2019  P1,62 hour female infant with -8% weight loss.  LC notice sign on door outside of patients room do not disturb,   LC  Is unable to  follow with Mom and infant concerning latch at this time.    Maternal Data    Feeding Feeding Type: Breast Fed  LATCH Score Latch: Repeated attempts needed to sustain latch, nipple held in mouth throughout feeding, stimulation needed to elicit sucking reflex.  Audible Swallowing: Spontaneous and intermittent  Type of Nipple: Everted at rest and after stimulation  Comfort (Breast/Nipple): Soft / non-tender  Hold (Positioning): Assistance needed to correctly position infant at breast and maintain latch.  LATCH Score: 8  Interventions    Lactation Tools Discussed/Used     Consult Status      Vicente Serene 09/13/2019, 12:54 AM

## 2019-09-13 NOTE — Discharge Summary (Signed)
Obstetric Discharge Summary Reason for Admission: cesarean section Prenatal Procedures: none Intrapartum Procedures: cesarean: low cervical, transverse Postpartum Procedures: none Complications-Operative and Postpartum: none Hemoglobin  Date Value Ref Range Status  09/11/2019 10.4 (L) 12.0 - 15.0 g/dL Final    Comment:    REPEATED TO VERIFY   HCT  Date Value Ref Range Status  09/11/2019 31.4 (L) 36.0 - 46.0 % Final    Physical Exam:  General: alert, cooperative and no distress Lochia: appropriate Uterine Fundus: firm Incision: healing well DVT Evaluation: No evidence of DVT seen on physical exam.  Discharge Diagnoses: Term Pregnancy-delivered  Discharge Information: Date: 09/13/2019 Activity: pelvic rest and lifting restrictions Diet: routine Medications: Ibuprofen, Iron and Percocet Condition: stable Instructions: refer to practice specific booklet Discharge to: home   Newborn Data: Live born female  Birth Weight: 11 lb 1.6 oz (5035 g) APGAR: 9, 9  Newborn Delivery   Birth date/time: 09/10/2019 10:39:00 Delivery type: C-Section, Low Transverse Trial of labor: No C-section categorization: Primary      Home with mother.  Charyl Bigger 09/13/2019, 10:08 AM

## 2019-09-13 NOTE — Lactation Note (Signed)
This note was copied from a baby's chart. Lactation Consultation Note  Patient Name: Hailey Carter BPZWC'H Date: 09/13/2019 Reason for consult: Follow-up assessment;Infant weight loss;Other (Comment);1st time breastfeeding;Primapara;Early term 37-38.6wks(10 % weight loss - baby changing to a Baby patient)  Baby is 41 hours old.  As LC entered the room, mom and dad mentioned the baby had to stay for Feedings. Per mom we have decided for now plan to pump and bottle feed until the milk comes in and may re-latch.  LC reviewed supply and demand / and the importance of consistent  Pumping around the clock to establish and protect milk supply. ( 8-10 times both breast for 15 - 20 mins , and save milk.  LC explored options with mom and reconfirmed what ever they decided  [facility] 's and the staff were here to support them .  LC mentioned SNS at the breast , or appetizer if needed, latch, supplement and post pump. Mom reconfirmed pumping and bottle feeding for now.  LC reviewed the DEBP set up and mom mentioned she had only gotten  Drops so far and the #24 F was comfortable.  LC recommended and encouraged plenty of fluids, and naps / rest.     Maternal Data Has patient been taught Hand Expression?: Yes  Feeding Feeding Type: (baby has recently fed) Nipple Type: Slow - flow  LATCH Score                   Interventions Interventions: Breast feeding basics reviewed;DEBP  Lactation Tools Discussed/Used Tools: Pump Breast pump type: Double-Electric Breast Pump Pump Review: Milk Storage;Setup, frequency, and cleaning Initiated by:: LC reviewed   Consult Status Consult Status: Follow-up Date: 09/14/19 Follow-up type: In-patient    Brady 09/13/2019, 11:42 AM

## 2019-09-13 NOTE — Progress Notes (Addendum)
No c/o; tol po, ambulating, voiding w/o difficulty; pain controlled +flatus, +bm x2; started to supplement with formula  Temp:  [98.1 F (36.7 C)-99 F (37.2 C)] 98.1 F (36.7 C) (09/22 0645) Pulse Rate:  [77-79] 77 (09/22 0645) Resp:  [18] 18 (09/22 0645) BP: (119-132)/(66-73) 119/69 (09/22 0645) SpO2:  [100 %] 100 % (09/21 2116)  A&ox3 rrr ctab Abd: +bs, soft, nt, nd; fundus - firm and below umb 2cm; dressing: c/d/i LE - +1 edema, nt bilat   CBC Latest Ref Rng & Units 09/11/2019 09/09/2019 09/17/2017  WBC 4.0 - 10.5 K/uL 10.6(H) 9.1 6.3  Hemoglobin 12.0 - 15.0 g/dL 10.4(L) 14.0 13.7  Hematocrit 36.0 - 46.0 % 31.4(L) 39.3 40.7  Platelets 150 - 400 K/uL 138(L) 180 202   A/P: pod3 s/p ltcs 1. Doing well, d/c home today; f/u in 1 wk for bp check 2. ghtn - nml bp, follow as outpt 3. Rh neg, s/p rhogam 4. Rubella Immune 5. Nursing/supplementing

## 2019-09-14 ENCOUNTER — Ambulatory Visit: Payer: Self-pay

## 2019-09-14 NOTE — Lactation Note (Signed)
This note was copied from a baby's chart. Lactation Consultation Note  Patient Name: Hailey Carter GGEZM'O Date: 09/14/2019   P1, Baby 93 hours old. Currently mother is pumping, bottle feeding and supplementing with formula. Offered to assist w/ latching if desired and parents declined at this time. Offered OP services.  Mother will call. Encouraged pumping 8 times per day using hands on pumping. Mother has personal DEBP at home. Reivewed volume guidelines increasing per day of life and as baby desires.  Reviewed engorgement care and monitoring voids/stools.      Maternal Data    Feeding Feeding Type: Bottle Fed - Breast Milk Nipple Type: Slow - flow  LATCH Score                   Interventions    Lactation Tools Discussed/Used     Consult Status      Carlye Grippe 09/14/2019, 8:37 AM

## 2022-07-08 ENCOUNTER — Ambulatory Visit: Payer: 59 | Admitting: Behavioral Health

## 2022-07-17 ENCOUNTER — Ambulatory Visit (INDEPENDENT_AMBULATORY_CARE_PROVIDER_SITE_OTHER): Payer: 59 | Admitting: Behavioral Health

## 2022-07-17 DIAGNOSIS — F419 Anxiety disorder, unspecified: Secondary | ICD-10-CM | POA: Diagnosis not present

## 2022-07-17 DIAGNOSIS — F4323 Adjustment disorder with mixed anxiety and depressed mood: Secondary | ICD-10-CM | POA: Diagnosis not present

## 2022-07-21 NOTE — Progress Notes (Signed)
                Milcah Dulany L Jayland Null, LMFT 

## 2022-07-21 NOTE — Progress Notes (Signed)
[facility] Counselor Initial Adult Exam  Name: Hailey Carter Date: 07/21/2022 MRN: [id number] DOB: 11-20-1976 PCP: Adolph Pollack, FNP  Time spent: 60 min  Guardian/Payee:  Self    Paperwork requested: No   Reason for Visit /Presenting Problem: Family discord that has worsened since birth of Son Theron Arista with Pt's [name]  Mental Status Exam: Appearance:   Well Groomed     Behavior:  Appropriate  Motor:  Normal  Speech/Language:   Normal Rate  Affect:  Appropriate  Mood:  anxious  Thought process:  normal  Thought content:    WNL  Sensory/Perceptual disturbances:    WNL  Orientation:  oriented to person, place, and time/date  Attention:  Good  Concentration:  Good  Memory:  WNL  Fund of knowledge:   Good  Insight:    Good  Judgment:   Good  Impulse Control:  Good   Appearance: Well Groomed    Behavior: Appropriate Motor: Normal Speech/Language: Normal Rate Affect: Appropriate Mood: anxious Thought process: normal Thought content: WNL Sensory/Perceptual disturbances: WNL Orientation: oriented to person, place, and time/date Attention: Good Concentration: Good Memory: WNL Fund of knowledge: Good Insight: Good Judgment: Good Impulse Control: Good  Reported Symptoms:  Pt & [name] have given his Mother a moratorium on their communication efforts since May 2023. The situation is impacting their own relationship as Pt feels rejected & disliked by husb's Mother & the Cpl feels their own Parenting style is disrespected/ignored by [name].  Risk Assessment: Danger to Self:  No Self-injurious Behavior: No Danger to Others: No Duty to Warn:no Physical Aggression / Violence:No  Access to Firearms a concern: No  Gang Involvement:No  Patient / guardian was educated about steps to take if suicide or homicide risk level increases between visits: no While future psychiatric events cannot be accurately predicted, the patient does not currently require acute inpatient  psychiatric care and does not currently meet [facility] involuntary commitment criteria.  Substance Abuse History: Current substance abuse: No     Past Psychiatric History:   Previous psychological history is significant for post-miscarriage dep X four events in the recent past. Outpatient Providers:PCP & Ind Therapist for [name] (Dr. Casper Harrison, MD) History of Psych Hospitalization: No  Psychological Testing:  n/a    Abuse History:  Victim of: No.,  n/a    Report needed: No. Victim of Neglect:No. Perpetrator of  n/a   Witness / Exposure to Domestic Violence: No   Protective Services Involvement: No  Witness to [employer] Violence:  No   Family History:  Family History  Problem Relation Age of Onset   Diabetes Mother    Hypertension Mother     Living situation: the patient lives with their family  Sexual Orientation: Straight  Relationship Status: married  Name of spouse / other:Stephen If a parent, number of children / ages:Son Theron Arista is almost 2yo  Support Systems: n/a  Financial Stress:  No   Income/Employment/Disability: Employment of [name]; Pt is Ret Education officer, environmental: Yes - [name] has 2 Deployments to [place] lasting 7 mos ea  Educational History: Education: Both Parents graduated Magazine features editor: Catholic & Protestant backgrounds  Any cultural differences that may affect / interfere with treatment:  spiritual concerns / distress  Recreation/Hobbies: gardening, reading  Stressors: Marital or family conflict  with [name]  Strengths: Supportive Relationships, Family, Friends, Church, Spirituality, and Able to Communicate Effectively  Barriers:  Difficult not to like [name] due to her hurtful beh  Legal History: Pending legal issue / charges: The patient has no significant history of legal issues. History of legal issue / charges:  n/a  Medical History/Surgical History: reviewed Past Medical History:  Diagnosis Date    Anxiety    Complication of anesthesia    Depression    GERD (gastroesophageal reflux disease)    Headache    Migraines   Leg swelling    right leg recently had surgical procedure   PONV (postoperative nausea and vomiting)     Past Surgical History:  Procedure Laterality Date   CESAREAN SECTION N/A 09/10/2019   Procedure: Primary CESAREAN SECTION;  Surgeon: Brien Few, MD;  Location: [place] [facility];  Service: Obstetrics;  Laterality: N/A;  EDD: 09/26/19   CHOLECYSTECTOMY     COLONOSCOPY     DILATION AND EVACUATION N/A 09/17/2017   Procedure: DILATATION AND EVACUATION;  Surgeon: Brien Few, MD;  Location: [place] [facility];  Service: Gynecology;  Laterality: N/A;   ECTOPIC PREGNANCY SURGERY     IM NAILING TIBIA Right 07/10/2017   ORIF ANKLE FRACTURE Right 07/10/2017   TIBIA IM NAIL INSERTION Right 07/10/2017   Procedure: INTRAMEDULLARY (IM) NAIL TIBIAL ORIF ANKLE FRACTURE;  Surgeon: Meredith Pel, MD;  Location: [place];  Service: Orthopedics;  Laterality: Right;   WISDOM TOOTH EXTRACTION      Medications: Current Outpatient Medications  Medication Sig Dispense Refill   butalbital-acetaminophen-caffeine (FIORICET) 50-325-40 MG tablet Take 1 tablet by mouth 2 (two) times daily as needed for headache or migraine.     ibuprofen (ADVIL) 800 MG tablet Take 1 tablet (800 mg total) by mouth every 6 (six) hours. 30 tablet 0   iron polysaccharides (NIFEREX) 150 MG capsule Take 1 capsule (150 mg total) by mouth daily. 90 capsule 0   montelukast (SINGULAIR) 10 MG tablet Take 10 mg by mouth at bedtime.     oxyCODONE-acetaminophen (PERCOCET/ROXICET) 5-325 MG tablet Take 1-2 tablets by mouth every 4 (four) hours as needed for moderate pain. 30 tablet 0   Prenatal Vit-Fe Fumarate-FA (PRENATAL MULTIVITAMIN) TABS tablet Take 1 tablet by mouth daily at 12 noon.     sertraline (ZOLOFT) 50 MG tablet Take 50 mg by mouth daily.     No current facility-administered medications for this visit.     Allergies  Allergen Reactions   Morphine And Related Nausea And Vomiting    I    Diagnoses:  No diagnosis found.  Plan of Care: Support Pt & [name] w/Cpl Th to surmount this difficult time btwn F members. [employer].   Donnetta Hutching, LMFT

## 2022-08-06 ENCOUNTER — Ambulatory Visit (INDEPENDENT_AMBULATORY_CARE_PROVIDER_SITE_OTHER): Payer: 59 | Admitting: Behavioral Health

## 2022-08-06 DIAGNOSIS — F419 Anxiety disorder, unspecified: Secondary | ICD-10-CM | POA: Diagnosis not present

## 2022-08-06 DIAGNOSIS — F4323 Adjustment disorder with mixed anxiety and depressed mood: Secondary | ICD-10-CM | POA: Diagnosis not present

## 2022-08-06 DIAGNOSIS — Z638 Other specified problems related to primary support group: Secondary | ICD-10-CM

## 2022-08-06 NOTE — Progress Notes (Signed)
                Hailey Carter Hailey Malka Bocek, LMFT 

## 2022-08-06 NOTE — Progress Notes (Signed)
[facility] Counselor/Therapist Progress Note  Patient ID: Hailey Carter, MRN: [id number],    Date: 08/06/2022  Time Spent: 60   Treatment Type: Family with patient  Reported Symptoms: Elevated anx/dep due to Family conflict invl'g [name]'s Mother & her Tx  Mental Status Exam: Appearance:  Casual     Behavior: Appropriate  Motor: Normal, [name] is restless w/jumpy L foot   Speech/Language:  Normal Rate  Affect: Appropriate  Mood: normal  Thought process: normal  Thought content:   WNL  Sensory/Perceptual disturbances:   WNL  Orientation: oriented to person, place, and time/date  Attention: Good  Concentration: Good  Memory: WNL  Fund of knowledge:  Good  Insight:   Good  Judgment:  Good  Impulse Control: Good   Risk Assessment: Danger to Self:  No Self-injurious Behavior: No Danger to Others: No Duty to Warn:no Physical Aggression / Violence:No  Access to Firearms a concern: No  Gang Involvement:No   Subjective: Pt & [name] report there cont's to be an "estrangement" w/his Parents. They heve recently communcated to Mother they will not be speaking w/her for 3 mos. [name] seems to be overbearing & unwilling to allow the Cpl to make their own best decisions. On occasion, [name] has treated Pt in unkind ways & show disrespect for the Cpl in general.   Interventions: Narrative and Family Systems  Diagnosis: Anxiety Adjustment d/o with mixed anx/dep Family conflict  Plan: Provide psychoedu re: the continuum of Family dvlp & growth Assist Pt & [name] to differentiate from their FOO Prepare Pt & [name] for 'crucial conversation" in Oct  Lawana Chambers [place], [place]

## 2022-08-27 ENCOUNTER — Ambulatory Visit: Payer: 59 | Admitting: Behavioral Health

## 2022-08-27 DIAGNOSIS — F4323 Adjustment disorder with mixed anxiety and depressed mood: Secondary | ICD-10-CM | POA: Diagnosis not present

## 2022-08-27 DIAGNOSIS — Z638 Other specified problems related to primary support group: Secondary | ICD-10-CM

## 2022-08-27 DIAGNOSIS — F419 Anxiety disorder, unspecified: Secondary | ICD-10-CM

## 2022-08-27 NOTE — Progress Notes (Signed)
[facility] Counselor/Therapist Progress Note  Patient ID: CEOLA PARA, MRN: [id number],    Date: 08/27/2022  Time Spent: 60 min In Person Cpl Th @ [facility]   Treatment Type: Family with patient  Reported Symptoms: Pt & [name] are working through issues w/his Mother that are causing disruption. Cpl has stopped engagement w/her as a result of her lacking boundaries w/their Son & Parenting concerns.  Mental Status Exam: Appearance:  Casual     Behavior: Appropriate and Sharing  Motor: Normal  Speech/Language:  Clear and Coherent and Normal Rate  Affect: Appropriate and Tearful  Mood: anxious  Thought process: normal  Thought content:   WNL  Sensory/Perceptual disturbances:   WNL  Orientation: oriented to person, place, and time/date  Attention: Good  Concentration: Good  Memory: WNL  Fund of knowledge:  Good  Insight:   Good  Judgment:  Good  Impulse Control: Good   Risk Assessment: Danger to Self:  No Self-injurious Behavior: No Danger to Others: No Duty to Warn:no Physical Aggression / Violence:No  Access to Firearms a concern: No  Gang Involvement:No   Subjective: Pt & [name] working through Parenting issues w/his Mother. They have ceased communication w/her as a result of her behavior.   Interventions: Family Systems  Diagnosis:Anxiety  Adjustment disorder with mixed anxiety and depressed mood  Family conflict  Plan: LT: Promote acceptance of the differences in Parenting styles & define boundaries for [name]'s Mother to follow in the event they speak again in Oct. [name] does not feel reconciliation w/his Mother will happen.  ST: Mother may not change-only you can change how you respond to her in the eventual occurrence you allow her to visit again.   Deneise Lever, LMFT

## 2022-08-27 NOTE — Progress Notes (Signed)
                Hailey Carter [name], LMFT 

## 2022-09-24 ENCOUNTER — Ambulatory Visit: Payer: 59 | Admitting: Behavioral Health

## 2022-09-24 DIAGNOSIS — F419 Anxiety disorder, unspecified: Secondary | ICD-10-CM | POA: Diagnosis not present

## 2022-09-24 DIAGNOSIS — Z638 Other specified problems related to primary support group: Secondary | ICD-10-CM

## 2022-09-24 DIAGNOSIS — F4323 Adjustment disorder with mixed anxiety and depressed mood: Secondary | ICD-10-CM | POA: Diagnosis not present

## 2022-09-24 NOTE — Progress Notes (Signed)
[place] Counselor/Therapist Progress Note  Patient ID: ARIYANAH AGUADO, MRN: [id number],    Date: 09/24/2022  Time Spent: 68 min In Person @ [facility]   Treatment Type: Family with patient; Cpl Th  Reported Symptoms: Cpl have been negotiating w/[name]'s Parents for improved communication, boundaries, & independence in dec-mkg about their lives. [name] felt forced last Friday to cont the no contact guidelines they have had in place for 5 mos. Pt is upset & sad over the current circumstances & [name] has accepted the situation for the present.   Mental Status Exam: Appearance:  Neat     Behavior: Appropriate, Sharing, and at times tearful  Motor: Normal; [name] is fidgety  Speech/Language:  Clear and Coherent and Normal Rate  Affect: Appropriate  Mood: normal  Thought process: normal  Thought content:   WNL  Sensory/Perceptual disturbances:   WNL  Orientation: oriented to person, place, and time/date  Attention: Good  Concentration: Good  Memory: WNL  Fund of knowledge:  Good  Insight:   Good  Judgment:  Good  Impulse Control: Good   Risk Assessment: Danger to Self:  No Self-injurious Behavior: No Danger to Others: No Duty to Warn:no Physical Aggression / Violence:No  Access to Firearms a concern: No  Gang Involvement:No   Subjective: Pt & [name] describe the current situation causing difficulty w/his Parents. His Mother has written a formal apology to the Pt on email & they corresponded several times. [name] ended the exchange by alerting Parents to cont'd cut off of communication for the forseeable future.   Interventions: Psycho-education/Bibliotherapy and Family Systems  Diagnosis:Adjustment disorder with mixed anxiety and depressed mood  Family conflict  Anxiety  Plan: Season sts she is sad about the cont'd moratorium on communication w/her [name]. She is sad for her [name] & how his Mother treated him growing up. Both describe [name]'s Mother as bossy,  invasive of their time & relationship, & having no boundaries. Pt & [name] have created space in the relationship w/his Parents to now examine issues independently of them w/no new issues able to happen. Pt & [name] will make note of the differences they observe in the next few wks & bring to next session to discuss.  Target Date: 10/22/2022  Progress: 6  Frequency: Once every 3-4 wks  Modality: Family Therapy w/Cpl  Donnetta Hutching, LMFT

## 2022-09-24 NOTE — Progress Notes (Signed)
                Natiya Seelinger L Elajah Kunsman, LMFT 

## 2022-10-22 ENCOUNTER — Ambulatory Visit: Payer: 59 | Admitting: Behavioral Health

## 2022-10-22 DIAGNOSIS — F4323 Adjustment disorder with mixed anxiety and depressed mood: Secondary | ICD-10-CM | POA: Diagnosis not present

## 2022-10-22 NOTE — Progress Notes (Signed)
[place] Counselor/Therapist Progress Note  Patient ID: OLINE BELK, MRN: [id number],    Date: 10/22/2022  Time Spent: 32 min In Person @ [facility]   Treatment Type: Family with patient  Reported Symptoms: Reduction in anx/dep due to Cpl's decision to cease communication w/[name]'s Parents since they could not respect the boundaries Cpl has been explicit about in the past.   [name] is doing well @ work & Pt is acclimating to retirement & Motherhood.   Mental Status Exam: Appearance:  Casual     Behavior: Appropriate and Sharing  Motor: Normal  Speech/Language:  Clear and Coherent and Normal Rate  Affect: Appropriate  Mood: normal  Thought process: normal  Thought content:   WNL  Sensory/Perceptual disturbances:   WNL  Orientation: oriented to person, place, and time/date  Attention: Good  Concentration: Good  Memory: WNL  Fund of knowledge:  Good  Insight:   Good  Judgment:  Good  Impulse Control: Good   Risk Assessment: Danger to Self:  No Self-injurious Behavior: No Danger to Others: No Duty to Warn:no Physical Aggression / Violence:No  Access to Firearms a concern: No  Gang Involvement:No   Subjective: Pt reports normal parenting fatigue once a Toddler gives up napping. Parents are both trying to adjust to this transition & also care for themselves.   [name] has moved through his concerns for Training Duties & how best to navigate this responsibility @ work.  Cpl have adjusted to the cease in communication btwn themselves & [name]'s Parents. There has been no contact, although [name] has exp'd some stressful dreams related to this. [name] related he finds his Parents "tedious to deal with" & he shared a story from childhood that was concerning to Pt.   Interventions: Family Systems & Positive Parenting Tips  Diagnosis:Adjustment disorder with mixed anxiety and depressed mood  Plan: Season & [name] seem relaxed today. They moved through  [place] w/their Son & Tiana Loft sent a card to Son w/a gift in it. They both tolerated this contact, but ideally would have preferred no card be sent. If they have to tolerate cards on the Holidays, they will do this. Cpl has decided to maintain the cease in communication & plan to cont this through the [place]. They will report any glitches or concerns next session.   Target Date: 111/29/2023  Progress: 6  Frequency: Once monthly through the Holidays  Modality: [place], LMFT

## 2022-10-22 NOTE — Progress Notes (Signed)
                Hailey Carter L Becca Bayne, LMFT 

## 2022-11-19 ENCOUNTER — Ambulatory Visit: Payer: 59 | Admitting: Behavioral Health

## 2022-11-19 DIAGNOSIS — F419 Anxiety disorder, unspecified: Secondary | ICD-10-CM | POA: Diagnosis not present

## 2022-11-19 DIAGNOSIS — Z638 Other specified problems related to primary support group: Secondary | ICD-10-CM | POA: Diagnosis not present

## 2022-11-19 DIAGNOSIS — F4323 Adjustment disorder with mixed anxiety and depressed mood: Secondary | ICD-10-CM

## 2022-11-19 NOTE — Progress Notes (Signed)
                Irving Lubbers L Shareese Macha, LMFT 

## 2022-11-19 NOTE — Progress Notes (Signed)
[facility] Counselor/Therapist Progress Note  Patient ID: Hailey Carter, MRN: [id number],    Date: 11/19/2022  Time Spent: 55 min Caregility video; Pt & [name] are in [employer] Lot in private & Provider in Home Office   Treatment Type: Individual Therapy  Reported Symptoms: Reduction in anx/dep   Mental Status Exam: Appearance:  Casual     Behavior: Appropriate and Sharing  Motor: Normal  Speech/Language:  Clear and Coherent  Affect: Appropriate  Mood: normal  Thought process: normal  Thought content:   WNL  Sensory/Perceptual disturbances:   WNL  Orientation: oriented to person, place, and time/date  Attention: Good  Concentration: Good  Memory: WNL  Fund of knowledge:  Good  Insight:   Good  Judgment:  Good  Impulse Control: Good   Risk Assessment: Danger to Self:  No Self-injurious Behavior: No Danger to Others: No Duty to Warn:no Physical Aggression / Violence:No  Access to Firearms a concern: No  Gang Involvement:No   Subjective: Pt & [name] Hailey Carter are dealing w/no communication rule w/[name]'s Parents.   Interventions: Family Systems  Diagnosis:Adjustment disorder with mixed anxiety and depressed mood  Family conflict  Anxiety  Plan: Hailey Carter & Hailey Carter are acclimating to the new & revised schedule for the Holidays. It has been a hard transition. Cpl will continue to maintain their stance w/no communication.  Target Date: 12/19/2022  Progress: 5  Frequency: Twice monthly  Modality: Cpl Th  Deneise Lever, LMFT

## 2023-01-05 ENCOUNTER — Ambulatory Visit: Payer: 59 | Admitting: Behavioral Health

## 2023-01-05 DIAGNOSIS — Z638 Other specified problems related to primary support group: Secondary | ICD-10-CM | POA: Diagnosis not present

## 2023-01-05 DIAGNOSIS — F419 Anxiety disorder, unspecified: Secondary | ICD-10-CM | POA: Diagnosis not present

## 2023-01-05 NOTE — Progress Notes (Signed)
[place] Counselor/Therapist Progress Note  Patient ID: Hailey Carter, MRN: [id number],    Date: 01/06/2023  Time Spent: 80 min In Person @ [facility]   Treatment Type:  Cpl Th  Reported Symptoms: Elevated anx/dep & stress due to [name]'s recent disclosure he has routine SI that he has dealt w/since his [employer] beginning 12 yrs ago; 2 Deployments to [place] for 7 mos each.   Mental Status Exam: Appearance:  Casual     Behavior: Appropriate, Sharing, and Care-Taking  Motor: Normal  Speech/Language:  Clear and Coherent and Normal Rate  Affect: Congruent w/mood  Mood: normal  Thought process: normal  Thought content:   WNL  Sensory/Perceptual disturbances:   WNL  Orientation: oriented to person, place, and time/date  Attention: Good  Concentration: Good  Memory: WNL  Fund of knowledge:  Good  Insight:   Good  Judgment:  Good  Impulse Control: Good   Risk Assessment: Danger to Self:  No Self-injurious Behavior: No Danger to Others: No Duty to Warn:no Physical Aggression / Violence:No  Access to Firearms a concern: No  Gang Involvement:No   Subjective: Pt & [name] report a surprise visit by In Laws over Christmas to drop gifts by for [employer]. This tested their resolve & their tolerance for the 'No Contact' declaration they have in place.  Pt disclosed that [name] shared he is dealing w/SI quite a lot the past few months. It has been exacerbated by the stressors w/his Parents. [name] sts it has been bad since Oct 2023. He reports it has been a concern off & on since 12 yrs ago & his exit from the [employer]. [name] was deployed twice to [place].   [name] Hailey Carter is seeing Dr. Venetia Maxon for his escitalopram & his Lexapro. The Cpl contacted him for a medication review/chk & to describe the SI. Physician did not want to change medications or dosages @ that time.  Interventions: Family Systems and Suicide Risk Assessment/Intervention  Pt & Wife  advised to use 988 if needed & contact Clinician prn. Cpl acknowledged & agreed to Plan.  Diagnosis:Anxiety  Family conflict  Plan: [name] are concerned for [name]'s elevated SI in the recent past. In 2023, he exp'd more dep/anx & SI than his usual. The Cpl will externalize 'Death' & keep him @ bay by speaking to him, calling him out & punishing him when necessary to keep Hailey Carter safe.  Target Date: 02/06/2023  Progress: 4-Called Physician for consult  Frequency: Twice monthly  Modality: Cpl Th  Donnetta Hutching, LMFT

## 2023-01-05 NOTE — Progress Notes (Unsigned)
                Hailey Carter L Tarik Teixeira, LMFT 

## 2023-01-20 ENCOUNTER — Ambulatory Visit: Payer: 59 | Admitting: Behavioral Health

## 2023-01-20 DIAGNOSIS — Z638 Other specified problems related to primary support group: Secondary | ICD-10-CM

## 2023-01-20 DIAGNOSIS — F419 Anxiety disorder, unspecified: Secondary | ICD-10-CM

## 2023-01-20 NOTE — Progress Notes (Signed)
[place] Counselor/Therapist Progress Note  Patient ID: Hailey Carter, MRN: [id number],    Date: 01/20/2023  Time Spent: 102 min In Person @ [facility] Office   Treatment Type: Individual Therapy  Reported Symptoms: Reduction in anx/dep & stress since the Holidays & now [name] has stepped down from stressful position @ work  Mental Status Exam: Appearance:  Casual     Behavior: Appropriate and Sharing  Motor: Normal; [name] is drinking caffeine, & extremely restless w/inc'd tic movement  Speech/Language:  Clear and Coherent and Normal Rate  Affect: Appropriate  Mood: anxious  Thought process: normal  Thought content:   WNL  Sensory/Perceptual disturbances:   WNL  Orientation: oriented to person, place, and time/date  Attention: Good  Concentration: Good  Memory: WNL  Fund of knowledge:  Good  Insight:   Good  Judgment:  Good  Impulse Control: Good   Risk Assessment: Danger to Self:  No; dHusb expresses reduction in SI since last visit w/Clinician. He acknowledges inc in tic movement & relates it to stress. Self-injurious Behavior: No Danger to Others: No Duty to Warn:no Physical Aggression / Violence:No  Access to Firearms a concern: No  Gang Involvement:No   Subjective: Pt & [name] are doing well today. [name] recently stepped down his duties @ work & wants to [employer] care. He does not like the direction of his career as they change qualifications/responsibilities & pay. Pt is suppportive.   Interventions:  Family Syst work & Cpl concerns  Diagnosis:Anxiety  Family conflict  Plan: Normalized recent AWV for 3yo Son Hailey Carter. He is wnl for G & D. Provided video info for Parents to watch re: G & D. [name] instructed to contact Dr. Venetia Maxon, MD & get appt to ck in about his psychopharmacologicals & tic-related movements for safety sake as [name] has been under excess interpersonal stress.  Target Date: 2/30/2024  Progress: 3  Frequency: Once monthly  Modality: Cpl  Th  Donnetta Hutching, LMFT

## 2023-01-20 NOTE — Progress Notes (Signed)
                Tymir Terral L Aero Drummonds, LMFT 

## 2023-01-28 ENCOUNTER — Ambulatory Visit: Payer: 59 | Admitting: Behavioral Health

## 2023-02-17 ENCOUNTER — Ambulatory Visit: Payer: 59 | Admitting: Behavioral Health

## 2023-02-17 DIAGNOSIS — Z638 Other specified problems related to primary support group: Secondary | ICD-10-CM | POA: Diagnosis not present

## 2023-02-17 DIAGNOSIS — F419 Anxiety disorder, unspecified: Secondary | ICD-10-CM | POA: Diagnosis not present

## 2023-02-17 NOTE — Progress Notes (Signed)
                Hailey Carter L Krishika Bugge, LMFT 

## 2023-02-17 NOTE — Progress Notes (Signed)
[place] Counselor/Therapist Progress Note  Patient ID: Hailey Carter, MRN: [id number],    Date: 02/17/2023  Time Spent: 31 min In Person @ [facility] Office   Treatment Type:  Cpl Th  Reported Symptoms: Reduced concerns for Paternal Gparents & their communication w/Cpl since last Oct when Pt & [name] decided to put a "No Contact Order" in place w/his Parents. Since that time Pt feels more peace in the relationship w/her [name] & w/in herself.   Mental Status Exam: Appearance:  Casual     Behavior: Appropriate and Sharing  Motor: Normal  Speech/Language:  Clear and Coherent  Affect: Appropriate  Mood: Normal  Thought process: normal  Thought content:   WNL  Sensory/Perceptual disturbances:   WNL  Orientation: oriented to person, place, and time/date  Attention: Good  Concentration: Good  Memory: WNL  Fund of knowledge:  Good  Insight:   Good  Judgment:  Good  Impulse Control: Good   Risk Assessment: Danger to Self:  No Self-injurious Behavior: No Danger to Others: No Duty to Warn:no Physical Aggression / Violence:No  Access to Firearms a concern: No  Gang Involvement:No   Subjective: Pt is upbeat today although they rec'd a Valentine's Day Card for their Son from his Paternal Gparents. They are sending all mail back 'Return to Sender'. This makes [name] feel bad for this action, but both are still happy w/the communication decision. Pt described a similar Hx w/her own Mother after her Parents divorced. She reduced contact w/her for 9-15 yrs prior to resuming any contact greater than twice a year.  [name] has a f/u Appt w/Dr. Venetia Maxon on Mar 19th, 2024.   Interventions: Family Systems  Diagnosis:Anxiety  Family conflict  Plan: Cpl are content to leave the 'No Contact Order' alone for now. Pt is quite emot'l when speaking about her Dad & how she wants to model Parenting after his style; encouragement, belief & independence w/out Co-dependence or enabling. They  will try to keep an open mind re: older Family members & their place in Son's life.   Target Date: 03/18/2023  Progress: 5  Frequency: Once monthly  Modality: Cpl Th  Donnetta Hutching, LMFT

## 2023-03-11 ENCOUNTER — Ambulatory Visit: Payer: 59 | Admitting: Behavioral Health

## 2023-03-11 ENCOUNTER — Ambulatory Visit (INDEPENDENT_AMBULATORY_CARE_PROVIDER_SITE_OTHER): Payer: 59 | Admitting: Behavioral Health

## 2023-03-11 DIAGNOSIS — F419 Anxiety disorder, unspecified: Secondary | ICD-10-CM

## 2023-03-11 DIAGNOSIS — F4323 Adjustment disorder with mixed anxiety and depressed mood: Secondary | ICD-10-CM | POA: Diagnosis not present

## 2023-03-11 NOTE — Progress Notes (Signed)
[place] Counselor/Therapist Progress Note  Patient ID: Hailey Carter, MRN: [id number],    Date: 03/11/2023  Time Spent: 22 min Caregility video; Pt & Spouse in home in private & Provider is remote from Home Office   Treatment Type: Individual Therapy  Reported Symptoms: Elevated anx/dep re: 48yo Son Peter's growth & dvlpmt  Mental Status Exam: Appearance:  Casual     Behavior: Appropriate and Sharing  Motor: Normal  Speech/Language:  Clear and Coherent and Normal Rate  Affect: Appropriate  Mood: normal  Thought process: normal  Thought content:   WNL  Sensory/Perceptual disturbances:   WNL  Orientation: oriented to person, place, and time/date  Attention: Good  Concentration: Good  Memory: WNL  Fund of knowledge:  Good  Insight:   Good  Judgment:  Good  Impulse Control: Good   Risk Assessment: Danger to Self:  No Self-injurious Behavior: No Danger to Others: No Duty to Warn:no Physical Aggression / Violence:No  Access to Firearms a concern: No  Gang Involvement:No   Subjective: Pt & [name] [name] are mtg jointly w/Clinician today. They are both concerned for Son Peter's G & D. Pt is thinking her mood impacts her relationship w/her [name]. They are both thinking the chronic stress has caused dreams about [name]'s Mother.   [name]'s 39th Bday went unrecognized by his Mom as she is trying to adhere to the 'No Contact Order'. They cont to adjust to this situation & feel positive about their decision.  [name] is due to see Dr. Venetia Maxon in the next few wks to manage/monitor meds.   Interventions: Family Systems  Diagnosis:Anxiety  Adjustment disorder with mixed anxiety and depressed mood  Plan: Pt & [name] are worried for the G & D of their 48yo Son & requested a few Referrals today. Provided 2 Referrals & suggested the Pediatrician give them another & they f/u on this. [name] will see his PCP & f/u as suggested.  Target Date: 04/21/2023  Progress: 8  Frequency: Once  every 5 wks & prn as indicated  Modality: Cpl Th  Donnetta Hutching, LMFT

## 2023-03-11 NOTE — Progress Notes (Signed)
                Garnett Nunziata L Kynzi Levay, LMFT 

## 2023-03-11 NOTE — Progress Notes (Unsigned)
                Dashay Giesler L Ronny Ruddell, LMFT 

## 2023-04-14 ENCOUNTER — Ambulatory Visit: Payer: 59 | Admitting: Behavioral Health

## 2023-04-14 DIAGNOSIS — F419 Anxiety disorder, unspecified: Secondary | ICD-10-CM | POA: Diagnosis not present

## 2023-04-14 DIAGNOSIS — Z638 Other specified problems related to primary support group: Secondary | ICD-10-CM

## 2023-04-14 NOTE — Addendum Note (Signed)
Addended by: Deneise Lever on: 04/14/2023 02:46 PM   Modules accepted: Level of Service

## 2023-04-14 NOTE — Progress Notes (Signed)
[facility] Counselor/Therapist Progress Note  Patient ID: Hailey Carter, MRN: [id number],    Date: 04/14/2023  Time Spent: 55 min Caregility video; Pt is home w/[name] in their Living Rm & Provider is working from [employer]   Treatment Type:  Cpl Th  Reported Symptoms: Elevated anx/dep  due Son's edu  Mental Status Exam: Appearance:  Casual     Behavior: Appropriate and Sharing  Motor: Normal  Speech/Language:  Clear and Coherent  Affect: Appropriate  Mood: anxious  Thought process: normal  Thought content:   WNL  Sensory/Perceptual disturbances:   WNL  Orientation: oriented to person, place, and time/date  Attention: Good  Concentration: Good  Memory: WNL  Fund of knowledge:  Good  Insight:   Good  Judgment:  Good  Impulse Control: Good   Risk Assessment: Danger to Self:  No Self-injurious Behavior: No Danger to Others: No Duty to Warn:no Physical Aggression / Violence:No  Access to Firearms a concern: No  Gang Involvement:No   Subjective: Pt & [name] are concerned for Son's developmental dvlpmt. They are concerned for his Px, Mtl, & Cog dvlpmt. They are naviagating his dvlpmt'l trajectory.   Interventions: Family Systems  Diagnosis:Anxiety  Family conflict  Plan: Season & Jeannett Senior will consider the status of their Son's dvpmt on the ASD Spectur[employer]. He is 48yo & his strategic needs @ this time. They will pursue all efforts for his resources.  Target Date: 5/303/2024  Progress: 5  Frequency: Once every 3-4 wks  Modality: Cpl Th  Deneise Lever, LMFT

## 2023-04-14 NOTE — Progress Notes (Signed)
                Jhan Conery L Gean Laursen, LMFT 

## 2023-05-20 ENCOUNTER — Ambulatory Visit: Payer: 59 | Admitting: Behavioral Health
